# Patient Record
Sex: Male | Born: 1975 | Race: White | Hispanic: No | Marital: Married | Smoking: Former smoker
Health system: Southern US, Community
[De-identification: ages and names within clinical notes are randomized; demographics above are authoritative.]

## PROBLEM LIST (undated history)

## (undated) DIAGNOSIS — I10 Essential (primary) hypertension: Secondary | ICD-10-CM

## (undated) DIAGNOSIS — E785 Hyperlipidemia, unspecified: Secondary | ICD-10-CM

## (undated) HISTORY — DX: Essential (primary) hypertension: I10

## (undated) HISTORY — PX: HERNIA REPAIR: SHX51

## (undated) HISTORY — PX: BACK SURGERY: SHX140

## (undated) HISTORY — PX: APPENDECTOMY: SHX54

## (undated) HISTORY — PX: HAND TENDON SURGERY: SHX663

## (undated) HISTORY — DX: Hyperlipidemia, unspecified: E78.5

---

## 2013-02-02 ENCOUNTER — Ambulatory Visit: Payer: Self-pay | Admitting: Family Medicine

## 2013-09-22 ENCOUNTER — Ambulatory Visit: Payer: Self-pay | Admitting: Unknown Physician Specialty

## 2013-09-30 DIAGNOSIS — M5126 Other intervertebral disc displacement, lumbar region: Secondary | ICD-10-CM | POA: Insufficient documentation

## 2013-09-30 DIAGNOSIS — M545 Low back pain, unspecified: Secondary | ICD-10-CM | POA: Insufficient documentation

## 2014-08-06 ENCOUNTER — Ambulatory Visit (INDEPENDENT_AMBULATORY_CARE_PROVIDER_SITE_OTHER): Payer: No Typology Code available for payment source | Admitting: Family Medicine

## 2014-08-06 ENCOUNTER — Other Ambulatory Visit: Payer: Self-pay

## 2014-08-06 ENCOUNTER — Encounter: Payer: Self-pay | Admitting: Family Medicine

## 2014-08-06 VITALS — BP 136/98 | HR 70 | Ht 69.0 in | Wt 205.0 lb

## 2014-08-06 DIAGNOSIS — N63 Unspecified lump in unspecified breast: Secondary | ICD-10-CM

## 2014-08-06 DIAGNOSIS — I10 Essential (primary) hypertension: Secondary | ICD-10-CM | POA: Diagnosis not present

## 2014-08-06 DIAGNOSIS — F909 Attention-deficit hyperactivity disorder, unspecified type: Secondary | ICD-10-CM | POA: Insufficient documentation

## 2014-08-06 DIAGNOSIS — M5136 Other intervertebral disc degeneration, lumbar region: Secondary | ICD-10-CM | POA: Insufficient documentation

## 2014-08-06 DIAGNOSIS — E7849 Other hyperlipidemia: Secondary | ICD-10-CM | POA: Insufficient documentation

## 2014-08-06 MED ORDER — HYDROCHLOROTHIAZIDE 12.5 MG PO TABS: 12.5000 mg | ORAL_TABLET | Freq: Every day | ORAL | Status: DC

## 2014-08-06 NOTE — Progress Notes (Signed)
Name: Eugene Huerta   MRN: [id number]    DOB: 1975/04/09   Date:08/06/2014       Progress Note  Subjective  Chief Complaint  Chief Complaint  Patient presents with  . Breast Mass    size of a pea/ sore to touch- also feels small knot under arm    Hypertension This is a recurrent problem. The current episode started today. The problem has been gradually worsening since onset. The problem is uncontrolled. Associated symptoms include anxiety. Pertinent negatives include no chest pain, headaches, malaise/fatigue or palpitations. (Backpain) There are no associated agents to hypertension. There are no known risk factors for coronary artery disease. Past treatments include ACE inhibitors. The current treatment provides mild improvement. Compliance problems include medication side effects (hives).  There is no history of angina, kidney disease or CVA.    No problem-specific assessment & plan notes found for this encounter.   Past Medical History  Diagnosis Date  . Hyperlipidemia     Past Surgical History  Procedure Laterality Date  . Back surgery      x 2  . Hernia repair    . Appendectomy    . Hand tendon surgery      x 2    Family History  Problem Relation Age of Onset  . Hypertension Father     History   Social History  . Marital Status: Married    Spouse Name: N/A  . Number of Children: N/A  . Years of Education: N/A   Occupational History  . Not on file.   Social History Main Topics  . Smoking status: Former Games developermoker  . Smokeless tobacco: Not on file  . Alcohol Use: No  . Drug Use: No  . Sexual Activity: Not on file   Other Topics Concern  . Not on file   Social History Narrative  . No narrative on file    No Known Allergies   Review of Systems  Constitutional: Negative for fever, chills, weight loss and malaise/fatigue.  HENT: Negative for congestion.   Cardiovascular: Negative for chest pain and palpitations.  Gastrointestinal: Negative for  heartburn, nausea and vomiting.  Genitourinary: Negative for dysuria.  Musculoskeletal: Positive for back pain.  Skin: Negative for rash.  Neurological: Negative for headaches.  Endo/Heme/Allergies: Does not bruise/bleed easily.  Psychiatric/Behavioral: Negative for depression.     Objective  Filed Vitals:   08/06/14 1025  BP: 136/98  Pulse: 70  Height: 5\' 9"  (1.753 m)  Weight: 205 lb (92.987 kg)    Physical Exam  Constitutional: He is oriented to person, place, and time.  HENT:  Head: Normocephalic.  Right Ear: External ear normal.  Left Ear: External ear normal.  Nose: Nose normal.  Mouth/Throat: Oropharynx is clear and moist.  Eyes: Conjunctivae and EOM are normal. Pupils are equal, round, and reactive to light.  Neck: Normal range of motion. Neck supple. No thyromegaly present.  Cardiovascular: Normal rate, regular rhythm, normal heart sounds and intact distal pulses.   No murmur heard. Pulmonary/Chest: Effort normal and breath sounds normal. No respiratory distress. Left breast exhibits mass and tenderness. Left breast exhibits no inverted nipple and no nipple discharge.  1 cm /tender left upper quad  Abdominal: Soft. Bowel sounds are normal.  Lymphadenopathy:    He has no cervical adenopathy.  Neurological: He is alert and oriented to person, place, and time.  Skin: He is not diaphoretic. No erythema.      No results found for this or any  previous visit (from the past 2160 hour(s)).   Assessment & Plan  Problem List Items Addressed This Visit    None    Visit Diagnoses    Essential hypertension    -  Primary    Relevant Medications    hydrochlorothiazide (HYDRODIURIL) 12.5 MG tablet    Breast mass in male        Relevant Orders    MM Digital Diagnostic Unilat L         Dr. [facility]  08/06/2014

## 2014-08-06 NOTE — Addendum Note (Signed)
Addended by: Arthur HolmsLYNCH, Arne Schlender L on: 08/06/2014 02:00 PM   Modules accepted: Orders

## 2014-08-12 ENCOUNTER — Other Ambulatory Visit: Payer: Self-pay | Admitting: Family Medicine

## 2014-08-12 ENCOUNTER — Ambulatory Visit: Payer: No Typology Code available for payment source

## 2014-08-12 ENCOUNTER — Ambulatory Visit
Admission: RE | Admit: 2014-08-12 | Discharge: 2014-08-12 | Disposition: A | Discharge status: Home / Self Care | Payer: No Typology Code available for payment source | Source: Ambulatory Visit | Attending: Family Medicine | Admitting: Family Medicine

## 2014-08-12 DIAGNOSIS — N63 Unspecified lump in unspecified breast: Secondary | ICD-10-CM

## 2014-08-12 DIAGNOSIS — N62 Hypertrophy of breast: Secondary | ICD-10-CM | POA: Diagnosis not present

## 2014-09-15 ENCOUNTER — Ambulatory Visit: Payer: No Typology Code available for payment source | Admitting: Family Medicine

## 2014-12-17 ENCOUNTER — Other Ambulatory Visit: Payer: Self-pay | Admitting: Family Medicine

## 2015-03-07 ENCOUNTER — Other Ambulatory Visit: Payer: Self-pay | Admitting: Family Medicine

## 2015-09-07 ENCOUNTER — Other Ambulatory Visit: Payer: Self-pay

## 2015-09-09 ENCOUNTER — Ambulatory Visit (INDEPENDENT_AMBULATORY_CARE_PROVIDER_SITE_OTHER): Payer: Medicaid Other | Admitting: Family Medicine

## 2015-09-09 ENCOUNTER — Encounter: Payer: Self-pay | Admitting: Family Medicine

## 2015-09-09 VITALS — BP 138/98 | HR 80 | Ht 69.0 in | Wt 212.0 lb

## 2015-09-09 DIAGNOSIS — E785 Hyperlipidemia, unspecified: Secondary | ICD-10-CM | POA: Diagnosis not present

## 2015-09-09 DIAGNOSIS — I10 Essential (primary) hypertension: Secondary | ICD-10-CM

## 2015-09-09 MED ORDER — HYDROCHLOROTHIAZIDE 25 MG PO TABS: ORAL_TABLET | ORAL | Status: DC

## 2015-09-09 NOTE — Progress Notes (Signed)
Name: Eugene Huerta   MRN: [id number]    DOB: 1975-04-23   Date:09/09/2015       Progress Note  Subjective  Chief Complaint  Chief Complaint  Patient presents with  . ADHD    advised by his pain Dr. to get back on med especially when driving long distances    HPI Comments: Patient told unlikely to restart stimulant since bp elevated/ not working  Hypertension This is a recurrent problem. The current episode started more than 1 year ago. The problem has been gradually worsening since onset. The problem is uncontrolled. Pertinent negatives include no anxiety, blurred vision, chest pain, headaches, malaise/fatigue, neck pain, orthopnea, palpitations, peripheral edema, PND, shortness of breath or sweats. There are no associated agents to hypertension. There are no known risk factors for coronary artery disease. Past treatments include nothing. The current treatment provides no improvement. There are no compliance problems.  There is no history of angina, kidney disease, CAD/MI, CVA, heart failure, left ventricular hypertrophy, PVD, renovascular disease or retinopathy. There is no history of chronic renal disease or a hypertension causing med.    No problem-specific assessment & plan notes found for this encounter.   Past Medical History  Diagnosis Date  . Hyperlipidemia   . Hypertension     Past Surgical History  Procedure Laterality Date  . Back surgery      x 2  . Hernia repair    . Appendectomy    . Hand tendon surgery      x 2    Family History  Problem Relation Age of Onset  . Hypertension Father     Social History   Social History  . Marital Status: Married    Spouse Name: N/A  . Number of Children: N/A  . Years of Education: N/A   Occupational History  . Not on file.   Social History Main Topics  . Smoking status: Former Games developermoker  . Smokeless tobacco: Not on file  . Alcohol Use: No  . Drug Use: No  . Sexual Activity: Yes   Other Topics Concern  .  Not on file   Social History Narrative    No Known Allergies   Review of Systems  Constitutional: Negative for fever, chills, weight loss and malaise/fatigue.  HENT: Negative for ear discharge, ear pain and sore throat.   Eyes: Negative for blurred vision.  Respiratory: Negative for cough, sputum production, shortness of breath and wheezing.   Cardiovascular: Negative for chest pain, palpitations, orthopnea, leg swelling and PND.  Gastrointestinal: Negative for heartburn, nausea, abdominal pain, diarrhea, constipation, blood in stool and melena.  Genitourinary: Negative for dysuria, urgency, frequency and hematuria.  Musculoskeletal: Positive for back pain. Negative for myalgias, joint pain and neck pain.  Skin: Negative for rash.  Neurological: Negative for dizziness, tingling, sensory change, focal weakness and headaches.  Endo/Heme/Allergies: Negative for environmental allergies and polydipsia. Does not bruise/bleed easily.  Psychiatric/Behavioral: Negative for depression and suicidal ideas. The patient is not nervous/anxious and does not have insomnia.      Objective  Filed Vitals:   09/09/15 1055  BP: 138/98  Pulse: 80  Height: 5\' 9"  (1.753 m)  Weight: 212 lb (96.163 kg)    Physical Exam  Constitutional: He is oriented to person, place, and time and well-developed, well-nourished, and in no distress.  HENT:  Head: Normocephalic.  Right Ear: External ear normal.  Left Ear: External ear normal.  Nose: Nose normal.  Mouth/Throat: Oropharynx is clear and moist.  Eyes: Conjunctivae and EOM are normal. Pupils are equal, round, and reactive to light. Right eye exhibits no discharge. Left eye exhibits no discharge. No scleral icterus.  Neck: Normal range of motion. Neck supple. No JVD present. No tracheal deviation present. No thyromegaly present.  Cardiovascular: Normal rate, regular rhythm, normal heart sounds and intact distal pulses.  Exam reveals no gallop and no  friction rub.   No murmur heard. Pulmonary/Chest: Breath sounds normal. No respiratory distress. He has no wheezes. He has no rales.  Abdominal: Soft. Bowel sounds are normal. He exhibits no mass. There is no hepatosplenomegaly. There is no tenderness. There is no rebound, no guarding and no CVA tenderness.  Musculoskeletal: Normal range of motion. He exhibits no edema or tenderness.  Lymphadenopathy:    He has no cervical adenopathy.  Neurological: He is alert and oriented to person, place, and time. He has normal sensation, normal strength, normal reflexes and intact cranial nerves. No cranial nerve deficit.  Skin: Skin is warm. No rash noted.  Psychiatric: Mood and affect normal.  Nursing note and vitals reviewed.     Assessment & Plan  Problem List Items Addressed This Visit    None    Visit Diagnoses    Essential hypertension    -  Primary    Relevant Medications    hydrochlorothiazide (HYDRODIURIL) 25 MG tablet    Other Relevant Orders    Renal Function Panel    Hyperlipidemia        Relevant Medications    hydrochlorothiazide (HYDRODIURIL) 25 MG tablet    Other Relevant Orders    Lipid Profile         Dr. [facility] Sauereanna [facility]  09/09/2015

## 2015-09-10 LAB — RENAL FUNCTION PANEL
Albumin: 4.2 g/dL (ref 3.5–5.5)
BUN / CREAT RATIO: 14 (ref 9–20)
BUN: 15 mg/dL (ref 6–20)
CHLORIDE: 100 mmol/L (ref 96–106)
CO2: 25 mmol/L (ref 18–29)
Calcium: 9.7 mg/dL (ref 8.7–10.2)
Creatinine, Ser: 1.04 mg/dL (ref 0.76–1.27)
GFR calc non Af Amer: 91 mL/min/{1.73_m2} (ref >59)
GFR, EST AFRICAN AMERICAN: 105 mL/min/{1.73_m2} (ref >59)
Glucose: 96 mg/dL (ref 65–99)
POTASSIUM: 5.2 mmol/L (ref 3.5–5.2)
Phosphorus: 4.5 mg/dL (ref 2.5–4.5)
SODIUM: 144 mmol/L (ref 134–144)

## 2015-09-10 LAB — LIPID PANEL
Chol/HDL Ratio: 7 ratio units — ABNORMAL HIGH (ref 0.0–5.0)
Cholesterol, Total: 224 mg/dL — ABNORMAL HIGH (ref 100–199)
HDL: 32 mg/dL — ABNORMAL LOW (ref >39)
LDL Calculated: 134 mg/dL — ABNORMAL HIGH (ref 0–99)
Triglycerides: 288 mg/dL — ABNORMAL HIGH (ref 0–149)
VLDL Cholesterol Cal: 58 mg/dL — ABNORMAL HIGH (ref 5–40)

## 2015-10-07 ENCOUNTER — Ambulatory Visit: Payer: Medicaid Other | Admitting: Family Medicine

## 2015-10-11 ENCOUNTER — Ambulatory Visit: Payer: Medicaid Other | Admitting: Family Medicine

## 2015-10-13 ENCOUNTER — Ambulatory Visit (INDEPENDENT_AMBULATORY_CARE_PROVIDER_SITE_OTHER): Payer: Medicaid Other | Admitting: Family Medicine

## 2015-10-13 ENCOUNTER — Encounter: Payer: Self-pay | Admitting: Family Medicine

## 2015-10-13 VITALS — BP 130/98 | HR 64 | Ht 69.0 in | Wt 212.0 lb

## 2015-10-13 DIAGNOSIS — L918 Other hypertrophic disorders of the skin: Secondary | ICD-10-CM | POA: Diagnosis not present

## 2015-10-13 DIAGNOSIS — G4730 Sleep apnea, unspecified: Secondary | ICD-10-CM | POA: Insufficient documentation

## 2015-10-13 DIAGNOSIS — I10 Essential (primary) hypertension: Secondary | ICD-10-CM | POA: Diagnosis not present

## 2015-10-13 DIAGNOSIS — E785 Hyperlipidemia, unspecified: Secondary | ICD-10-CM

## 2015-10-13 MED ORDER — LISINOPRIL 10 MG PO TABS: 10.0000 mg | ORAL_TABLET | Freq: Every day | ORAL | 6 refills | Status: DC

## 2015-10-13 MED ORDER — SIMVASTATIN 20 MG PO TABS
20.0000 mg | ORAL_TABLET | ORAL | 6 refills | Status: DC
Start: 2015-10-13 — End: 2015-11-03

## 2015-10-13 NOTE — Progress Notes (Signed)
Name: Hallie Ishida   MRN: [id number]    DOB: [birth year]   Date:10/13/2015       Progress Note  Subjective  Chief Complaint  Chief Complaint  Patient presents with  . Hyperlipidemia    had labs but needs chol med sent in  . Sleep Apnea    has had witnessed apnea, snoring, and has woke himself up by gasping for air    Patient needs evaluation for sleep apnea/ positive snoring/ witness apneic episodes/ daytime somnolence/ somnolence while driving/ fall asleep in chair /    Hyperlipidemia  This is a chronic problem. The current episode started more than 1 year ago. The problem is controlled. Recent lipid tests were reviewed and are normal. He has no history of chronic renal disease, diabetes, hypothyroidism, liver disease, obesity or nephrotic syndrome. Factors aggravating his hyperlipidemia include thiazides. Pertinent negatives include no chest pain, focal sensory loss, focal weakness, leg pain, myalgias (hyperten) or shortness of breath. The current treatment provides significant improvement of lipids. There are no compliance problems.  Risk factors for coronary artery disease include dyslipidemia and male sex.  Hypertension  This is a chronic problem. The current episode started more than 1 year ago. The problem has been waxing and waning since onset. The problem is controlled. Associated symptoms include headaches and malaise/fatigue. Pertinent negatives include no anxiety, blurred vision, chest pain, neck pain, orthopnea, palpitations, peripheral edema, PND, shortness of breath or sweats. (Backpain) There is no history of chronic renal disease.    No problem-specific Assessment & Plan notes found for this encounter.   Past Medical History:  Diagnosis Date  . Hyperlipidemia   . Hypertension     Past Surgical History:  Procedure Laterality Date  . APPENDECTOMY    . BACK SURGERY     x 2  . HAND TENDON SURGERY     x 2  . HERNIA REPAIR      Family History  Problem  Relation Age of Onset  . Hypertension Father     Social History   Social History  . Marital status: Married    Spouse name: N/A  . Number of children: N/A  . Years of education: N/A   Occupational History  . Not on file.   Social History Main Topics  . Smoking status: Former Games developer  . Smokeless tobacco: Not on file  . Alcohol use No  . Drug use: No  . Sexual activity: Yes   Other Topics Concern  . Not on file   Social History Narrative  . No narrative on file    No Known Allergies   Review of Systems  Constitutional: Positive for malaise/fatigue. Negative for chills, fever and weight loss.  HENT: Negative for ear discharge, ear pain and sore throat.   Eyes: Negative for blurred vision.  Respiratory: Negative for cough, sputum production, shortness of breath and wheezing.   Cardiovascular: Negative for chest pain, palpitations, orthopnea, leg swelling and PND.  Gastrointestinal: Negative for abdominal pain, blood in stool, constipation, diarrhea, heartburn, melena and nausea.  Genitourinary: Negative for dysuria, frequency, hematuria and urgency.  Musculoskeletal: Positive for back pain. Negative for joint pain, myalgias (hyperten) and neck pain.  Skin: Negative for rash.  Neurological: Positive for headaches. Negative for dizziness, tingling, sensory change and focal weakness.  Endo/Heme/Allergies: Negative for environmental allergies and polydipsia. Does not bruise/bleed easily.  Psychiatric/Behavioral: Negative for depression and suicidal ideas. The patient is not nervous/anxious and does not have insomnia.  Objective  Vitals:   10/13/15 0816  BP: (!) 130/98  Pulse: 64  Weight: 212 lb (96.2 kg)  Height: 5\' 9"  (1.753 m)    Physical Exam  Constitutional: He is oriented to person, place, and time and well-developed, well-nourished, and in no distress.  HENT:  Head: Normocephalic.  Right Ear: External ear normal.  Left Ear: External ear normal.  Nose:  Nose normal.  Mouth/Throat: Oropharynx is clear and moist.  Eyes: Conjunctivae and EOM are normal. Pupils are equal, round, and reactive to light. Right eye exhibits no discharge. Left eye exhibits no discharge. No scleral icterus.  Neck: Normal range of motion. Neck supple. No JVD present. No tracheal deviation present. No thyromegaly present.  Cardiovascular: Normal rate, regular rhythm, normal heart sounds and intact distal pulses.  Exam reveals no gallop and no friction rub.   No murmur heard. Pulmonary/Chest: Breath sounds normal. No respiratory distress. He has no wheezes. He has no rales.  Abdominal: Soft. Bowel sounds are normal. He exhibits no mass. There is no hepatosplenomegaly. There is no tenderness. There is no rebound, no guarding and no CVA tenderness.  Musculoskeletal: Normal range of motion. He exhibits no edema or tenderness.  Lymphadenopathy:    He has no cervical adenopathy.  Neurological: He is alert and oriented to person, place, and time. He has normal sensation, normal strength, normal reflexes and intact cranial nerves. No cranial nerve deficit.  Skin: Skin is warm. No rash noted.  Psychiatric: Mood and affect normal.      Assessment & Plan  Problem List Items Addressed This Visit      Cardiovascular and Mediastinum   Essential hypertension   Relevant Medications   lisinopril (PRINIVIL,ZESTRIL) 10 MG tablet   simvastatin (ZOCOR) 20 MG tablet     Other   Hyperlipidemia - Primary   Relevant Medications   lisinopril (PRINIVIL,ZESTRIL) 10 MG tablet   simvastatin (ZOCOR) 20 MG tablet   Sleep apnea   Relevant Orders   Ambulatory referral to Sleep Studies    Other Visit Diagnoses    Skin tag       Relevant Orders   Ambulatory referral to Dermatology        Dr. Elizabeth Sauereanna Cagney [facility]  10/13/15

## 2015-11-03 ENCOUNTER — Ambulatory Visit (INDEPENDENT_AMBULATORY_CARE_PROVIDER_SITE_OTHER): Payer: BLUE CROSS/BLUE SHIELD | Admitting: Family Medicine

## 2015-11-03 ENCOUNTER — Encounter: Payer: Self-pay | Admitting: Family Medicine

## 2015-11-03 VITALS — BP 120/80 | HR 80 | Ht 69.0 in | Wt 219.0 lb

## 2015-11-03 DIAGNOSIS — F900 Attention-deficit hyperactivity disorder, predominantly inattentive type: Secondary | ICD-10-CM

## 2015-11-03 DIAGNOSIS — E785 Hyperlipidemia, unspecified: Secondary | ICD-10-CM

## 2015-11-03 DIAGNOSIS — I10 Essential (primary) hypertension: Secondary | ICD-10-CM | POA: Diagnosis not present

## 2015-11-03 MED ORDER — AMPHETAMINE-DEXTROAMPHET ER 20 MG PO CP24: 20.0000 mg | ORAL_CAPSULE | ORAL | 0 refills | Status: DC

## 2015-11-03 MED ORDER — SIMVASTATIN 20 MG PO TABS: 20.0000 mg | ORAL_TABLET | ORAL | 6 refills | Status: DC

## 2015-11-03 MED ORDER — LISINOPRIL 10 MG PO TABS: 10.0000 mg | ORAL_TABLET | Freq: Every day | ORAL | 6 refills | Status: AC

## 2015-11-03 MED ORDER — SIMVASTATIN 20 MG PO TABS
20.0000 mg | ORAL_TABLET | ORAL | 6 refills | Status: AC
Start: 2015-11-03 — End: ?

## 2015-11-03 MED ORDER — LISINOPRIL 10 MG PO TABS: 10.0000 mg | ORAL_TABLET | Freq: Every day | ORAL | 6 refills | Status: DC

## 2015-11-03 NOTE — Progress Notes (Signed)
Name: Eugene Huerta   MRN: [id number]    DOB: 1975/10/21   Date:11/03/2015       Progress Note  Subjective  Chief Complaint  Chief Complaint  Patient presents with  . Hypertension    Hypertension  This is a recurrent problem. The current episode started more than 1 year ago. The problem has been rapidly improving since onset. The problem is controlled. Pertinent negatives include no anxiety, blurred vision, chest pain, headaches, malaise/fatigue, neck pain, orthopnea, palpitations, peripheral edema, PND, shortness of breath or sweats. There are no associated agents to hypertension. There are no known risk factors for coronary artery disease. Past treatments include ACE inhibitors and diuretics. The current treatment provides no improvement. There are no compliance problems.  There is no history of angina, kidney disease, CAD/MI, CVA, heart failure, left ventricular hypertrophy, PVD, renovascular disease or retinopathy. There is no history of chronic renal disease or a hypertension causing med.    No problem-specific Assessment & Plan notes found for this encounter.   Past Medical History:  Diagnosis Date  . Hyperlipidemia   . Hypertension     Past Surgical History:  Procedure Laterality Date  . APPENDECTOMY    . BACK SURGERY     x 2  . HAND TENDON SURGERY     x 2  . HERNIA REPAIR      Family History  Problem Relation Age of Onset  . Hypertension Father     Social History   Social History  . Marital status: Married    Spouse name: N/A  . Number of children: N/A  . Years of education: N/A   Occupational History  . Not on file.   Social History Main Topics  . Smoking status: Former Games developer  . Smokeless tobacco: Not on file  . Alcohol use No  . Drug use: No  . Sexual activity: Yes   Other Topics Concern  . Not on file   Social History Narrative  . No narrative on file    No Known Allergies   Review of Systems  Constitutional: Negative for chills,  fever, malaise/fatigue and weight loss.  HENT: Negative for ear discharge, ear pain and sore throat.   Eyes: Negative for blurred vision.  Respiratory: Negative for cough, sputum production, shortness of breath and wheezing.   Cardiovascular: Negative for chest pain, palpitations, orthopnea, leg swelling and PND.  Gastrointestinal: Negative for abdominal pain, blood in stool, constipation, diarrhea, heartburn, melena and nausea.  Genitourinary: Negative for dysuria, frequency, hematuria and urgency.  Musculoskeletal: Negative for back pain, joint pain, myalgias and neck pain.  Skin: Negative for rash.  Neurological: Negative for dizziness, tingling, sensory change, focal weakness and headaches.  Endo/Heme/Allergies: Negative for environmental allergies and polydipsia. Does not bruise/bleed easily.  Psychiatric/Behavioral: Negative for depression and suicidal ideas. The patient is not nervous/anxious and does not have insomnia.      Objective  Vitals:   11/03/15 0914  BP: 120/80  Pulse: 80  Weight: 219 lb (99.3 kg)  Height: 5\' 9"  (1.753 m)    Physical Exam  Constitutional: He is oriented to person, place, and time and well-developed, well-nourished, and in no distress.  HENT:  Head: Normocephalic.  Right Ear: External ear normal.  Left Ear: External ear normal.  Nose: Nose normal.  Mouth/Throat: Oropharynx is clear and moist.  Eyes: Conjunctivae and EOM are normal. Pupils are equal, round, and reactive to light. Right eye exhibits no discharge. Left eye exhibits no discharge. No scleral  icterus.  Neck: Normal range of motion. Neck supple. No JVD present. No tracheal deviation present. No thyromegaly present.  Cardiovascular: Normal rate, regular rhythm, normal heart sounds and intact distal pulses.  Exam reveals no gallop and no friction rub.   No murmur heard. Pulmonary/Chest: Breath sounds normal. No respiratory distress. He has no wheezes. He has no rales.  Abdominal: Soft.  Bowel sounds are normal. He exhibits no mass. There is no hepatosplenomegaly. There is no tenderness. There is no rebound, no guarding and no CVA tenderness.  Musculoskeletal: Normal range of motion. He exhibits no edema or tenderness.  Lymphadenopathy:    He has no cervical adenopathy.  Neurological: He is alert and oriented to person, place, and time. He has normal sensation, normal strength and intact cranial nerves. No cranial nerve deficit.  Skin: Skin is warm. No rash noted.  Psychiatric: Mood and affect normal.  Nursing note and vitals reviewed.     Assessment & Plan  Problem List Items Addressed This Visit      Cardiovascular and Mediastinum   Essential hypertension - Primary   Relevant Medications   lisinopril (PRINIVIL,ZESTRIL) 10 MG tablet   simvastatin (ZOCOR) 20 MG tablet     Other   Attention deficit disorder with hyperactivity   Relevant Medications   amphetamine-dextroamphetamine (ADDERALL XR) 20 MG 24 hr capsule   Hyperlipidemia   Relevant Medications   lisinopril (PRINIVIL,ZESTRIL) 10 MG tablet   simvastatin (ZOCOR) 20 MG tablet    Other Visit Diagnoses   None.       Dr. Hayden Rasmusseneanna Kitiara Hintze [facility]  11/03/15

## 2015-11-29 ENCOUNTER — Other Ambulatory Visit: Payer: Self-pay

## 2016-01-02 ENCOUNTER — Other Ambulatory Visit: Payer: Self-pay

## 2016-01-05 ENCOUNTER — Other Ambulatory Visit: Payer: Self-pay

## 2016-02-13 ENCOUNTER — Other Ambulatory Visit: Payer: Self-pay

## 2016-03-12 ENCOUNTER — Other Ambulatory Visit: Payer: Self-pay

## 2016-04-19 ENCOUNTER — Other Ambulatory Visit: Payer: Self-pay

## 2016-04-19 ENCOUNTER — Telehealth: Payer: Self-pay

## 2016-04-20 NOTE — Telephone Encounter (Signed)
Pt called stating he had an appt with [facility]Sarah Nelson at [facility] on 06/25/2016. He needed his Adderrall refilled until his appt. Called pt to tell him it would be up front to pick up

## 2016-05-22 ENCOUNTER — Other Ambulatory Visit: Payer: Self-pay

## 2016-06-20 DIAGNOSIS — M5416 Radiculopathy, lumbar region: Secondary | ICD-10-CM | POA: Insufficient documentation

## 2016-06-29 DIAGNOSIS — G8929 Other chronic pain: Secondary | ICD-10-CM | POA: Insufficient documentation

## 2016-06-29 DIAGNOSIS — Z9689 Presence of other specified functional implants: Secondary | ICD-10-CM | POA: Insufficient documentation

## 2016-08-28 DIAGNOSIS — G4733 Obstructive sleep apnea (adult) (pediatric): Secondary | ICD-10-CM | POA: Insufficient documentation

## 2016-10-19 ENCOUNTER — Other Ambulatory Visit: Payer: Self-pay

## 2016-10-24 ENCOUNTER — Other Ambulatory Visit: Payer: Self-pay | Admitting: Family Medicine

## 2016-10-24 DIAGNOSIS — I10 Essential (primary) hypertension: Secondary | ICD-10-CM

## 2016-10-24 DIAGNOSIS — E785 Hyperlipidemia, unspecified: Secondary | ICD-10-CM

## 2017-06-06 ENCOUNTER — Other Ambulatory Visit: Payer: Self-pay

## 2017-07-03 DIAGNOSIS — G894 Chronic pain syndrome: Secondary | ICD-10-CM | POA: Insufficient documentation

## 2018-12-25 ENCOUNTER — Ambulatory Visit: Payer: Medicare Other | Admitting: Student in an Organized Health Care Education/Training Program

## 2019-02-04 ENCOUNTER — Encounter: Payer: Self-pay | Admitting: Student in an Organized Health Care Education/Training Program

## 2019-02-04 NOTE — Progress Notes (Signed)
Patient is a current patient at [facility].  He is going there for back pain in the mid and lower back that radiates into right buttocks and down the right leg. States this started approx 5 years ago and just woke up one morning and could not moves his legs.  He has had several surgeries that did help with weakness in the legs but is still having pain. He is unhappy with his care at [facility] and states it is too far to drive.  He has a SCS in place by [facility] but is uneffective.

## 2019-02-05 ENCOUNTER — Ambulatory Visit
Payer: Medicare Other | Attending: Student in an Organized Health Care Education/Training Program | Admitting: Student in an Organized Health Care Education/Training Program

## 2019-02-05 ENCOUNTER — Encounter: Payer: Self-pay | Admitting: Student in an Organized Health Care Education/Training Program

## 2019-02-05 ENCOUNTER — Other Ambulatory Visit: Payer: Self-pay

## 2019-02-05 VITALS — Ht 70.0 in | Wt 200.0 lb

## 2019-02-05 DIAGNOSIS — M5416 Radiculopathy, lumbar region: Secondary | ICD-10-CM | POA: Diagnosis not present

## 2019-02-05 DIAGNOSIS — M961 Postlaminectomy syndrome, not elsewhere classified: Secondary | ICD-10-CM

## 2019-02-05 DIAGNOSIS — M5441 Lumbago with sciatica, right side: Secondary | ICD-10-CM

## 2019-02-05 DIAGNOSIS — G8929 Other chronic pain: Secondary | ICD-10-CM

## 2019-02-05 DIAGNOSIS — F1290 Cannabis use, unspecified, uncomplicated: Secondary | ICD-10-CM | POA: Insufficient documentation

## 2019-02-05 DIAGNOSIS — M5136 Other intervertebral disc degeneration, lumbar region: Secondary | ICD-10-CM

## 2019-02-05 DIAGNOSIS — Z9689 Presence of other specified functional implants: Secondary | ICD-10-CM

## 2019-02-05 DIAGNOSIS — M5126 Other intervertebral disc displacement, lumbar region: Secondary | ICD-10-CM

## 2019-02-05 DIAGNOSIS — G894 Chronic pain syndrome: Secondary | ICD-10-CM | POA: Diagnosis not present

## 2019-02-05 MED ORDER — TIZANIDINE HCL 4 MG PO TABS: 4.0000 mg | ORAL_TABLET | Freq: Two times a day (BID) | ORAL | 2 refills | Status: AC | PRN

## 2019-02-05 MED ORDER — GABAPENTIN 600 MG PO TABS: 600.0000 mg | ORAL_TABLET | Freq: Every day | ORAL | 2 refills | Status: DC

## 2019-02-05 NOTE — Progress Notes (Signed)
Patient's Name: Eugene Huerta  MRN: [id number]  Referring Provider: Nelwyn Salisbury, Hershal Coria  DOB: [birth year]  PCP: Juline Patch, MD  DOS: 02/05/2019  Note by: Gillis Santa, MD  Service setting: Ambulatory outpatient  Specialty: Interventional Pain Management  Location: [facility] Virtual Visit  Visit type: Initial Patient Evaluation  Patient type: New Patient   Pain Management Virtual Encounter Note - Virtual Visit via [place] (real-time audio visits between healthcare provider and patient).   Patient's Phone No.:  [phone] (home); There is no such number on file (mobile).; (Preferred) [phone] [email or web address]  [place], [place] [address] Phone: [phone] Fax: [place], [place] [address] [place] [address] Phone: [phone] Fax: [place], [place] [address] Phone: [phone] Fax: [phone]    Pre-screening note:  Our staff contacted Mr. Shelnutt and offered him an "in person", "face-to-face" appointment versus a telephone encounter. He indicated preferring the telephone encounter, at this time.  Primary Reason(s) for Visit: Tele-Encounter for initial evaluation of one or more chronic problems (new to examiner) potentially causing chronic pain, and posing a threat to normal musculoskeletal function. (Level of risk: High) CC: Back Pain (mid and lower )  I contacted [name] on 02/05/2019 via video conference.      I clearly identified myself as Gillis Santa, MD. I verified that I was speaking with the correct person using two identifiers (Name: Eugene Huerta, and date of birth: August 30, 1975).  Advanced Informed Consent I sought verbal advanced consent from  [name] for virtual visit interactions. I informed Mr. Drone of possible security and privacy concerns, risks, and limitations associated with providing "not-in-person" medical evaluation and management services. I also informed Mr. Skillern of the availability of "in-person" appointments. Finally, I informed him that there would be a charge for the virtual visit and that he could be  personally, fully or partially, financially responsible for it. Mr. Nesheiwat expressed understanding and agreed to proceed.   HPI  Eugene Huerta is a 43 y.o. year old, male patient, contacted today for an initial evaluation of his chronic pain. He has Familial multiple lipoprotein-type hyperlipidemia; Attention deficit disorder with hyperactivity; Benign hypertension; DDD (degenerative disc disease), lumbar; Hyperlipidemia; Essential hypertension; Sleep apnea; Chronic pain syndrome; Lumbar radiculopathy, chronic; Spinal cord stimulator status; Chronic midline low back pain with right-sided sciatica; Lower back pain; Lumbar herniated disc; Obstructive sleep apnea (adult) (pediatric); Failed back surgical syndrome; Lumbar post-laminectomy syndrome; and Marijuana use on their problem list.  Pain Assessment: Location: Mid, Lower Back Radiating: pain radiates into right hip/buttocks and down the right leg Onset: More than a month ago Duration: Chronic pain Quality: Aching, Burning, Constant, Discomfort, Dull, Sharp, Shooting, Nagging Severity: 6 /10 (subjective, self-reported pain score)  Effect on ADL: when having pain is difficult to concentrate and get anything done. Timing: Constant Modifying factors: medications, rest, squatting  Onset and Duration: Sudden 2015 due to herniated disc:10/15/2013 Left L4/5 Microdiskectomy ([place]) 12/07/2013 Right L4/5 Microdiskectomy ([place])  Cause of pain: Unknown however worsened after surgery Severity: Getting worse Timing: Evening Aggravating Factors: Kneeling,  Lifiting, Motion, Prolonged standing, Twisting, Walking, Walking uphill and Walking downhill Alleviating Factors: Stretching, Hot packs, Lying down, Resting, Sleeping, Standing, TENS,  Relaxation therapy and Warm showers or baths, SCS Associated Problems: Numbness and Pain that does not allow patient to sleep Quality of Pain: Burning, Intermittent, Deep, Exhausting, Getting longer, Hot, Nagging, Pulsating, Punishing and Sharp Previous Examinations or Tests: CT scan, Epidurogram, MRI scan, Nerve block, X-rays, Nerve conduction test, Neurological evaluation, Neurosurgical evaluation, Orthopedic evaluation, Chiropractic evaluation and Psychiatric evaluation Previous Treatments: Epidural steroid injections, Facet blocks, Narcotic medications, Physical Therapy, Pool exercises, Radiofrequency, Relaxation therapy, Spinal cord stimulator, Steroid treatments by mouth, Strengthening exercises, Stretching exercises, TENS and Trigger point injections  Historic Controlled Substance Pharmacotherapy Review   12/29/2018  1   12/29/2018  Buprenorphine 10 Mcg/Hr Patch  4.00  28 Al Fer   [id number]   [place] (5798)   0  0.24 mg  Comm Ins   [place]     Medications: Bottles not available for inspection. Pharmacodynamics: Desired effects: Analgesia: The patient reports <50% benefit. Reported improvement in function: The patient reports medications have not provided significant benefit Clinically meaningful improvement in function (CMIF): Medication does not meet basic CMIF Perceived effectiveness: None Undesirable effects: Side-effects or Adverse reactions: None reported Historical Monitoring: The patient  reports no history of drug use. List of all UDS Test(s): No results found for: MDMA, COCAINSCRNUR, [place], [place], CANNABQUANT, THCU, [place] List of other Serum/Urine Drug Screening Test(s):  No results found for: AMPHSCRSER, BARBSCRSER, BENZOSCRSER, COCAINSCRSER, COCAINSCRNUR, PCPSCRSER, PCPQUANT, THCSCRSER, THCU,  CANNABQUANT, OPIATESCRSER, OXYSCRSER, PROPOXSCRSER, ETH Historical Background Evaluation: [place] PMP: PDMP reviewed during this encounter. Six (6) year initial data search conducted.              Department of public safety, offender search: Editor, commissioning Information) Non-contributory Risk Assessment Profile: Aberrant behavior: None observed or detected today Risk factors for fatal opioid overdose: age 21-15 years old and sleep apnea Fatal overdose hazard ratio (HR): Calculation deferred Non-fatal overdose hazard ratio (HR): Calculation deferred Risk of opioid abuse or dependence: 0.7-3.0% with doses ? 36 MME/day and 6.1-26% with doses ? 120 MME/day. Substance use disorder (SUD) risk level: High Personal History of Substance Abuse (SUD-Substance use disorder):  Alcohol: Negative  Illegal Drugs: Negative  Rx Drugs: Negative  ORT Risk Level calculation: Moderate Risk Opioid Risk Tool - 02/04/19 1452      Family History of Substance Abuse   Alcohol  Positive Male    Illegal Drugs  Negative    Rx Drugs  Negative      Personal History of Substance Abuse   Alcohol  Negative    Illegal Drugs  Negative    Rx Drugs  Negative      Age   Age between 71-45 years   Yes      Psychological Disease   Psychological Disease  Negative    Depression  Negative      Total Score   Opioid Risk Tool Scoring  4    Opioid Risk Interpretation  Moderate Risk      ORT Scoring interpretation table:  Score <3 = Low Risk for SUD  Score between 4-7 = Moderate Risk for SUD  Score >8 = High Risk for Opioid Abuse   Pharmacologic Plan: Mr. Molyneux indicated having a preference to stay away from opioid analgesics.            Initial impression: Mr. Heggie indicated having no interest in opioid therapy, at this point.  Meds   Current Outpatient Medications:  .  amphetamine-dextroamphetamine (ADDERALL XR) 20 MG 24 hr capsule, Take 1 capsule (20 mg total) by mouth 1  day or 1 dose. Reported on 09/09/2015, Disp: 30  capsule, Rfl: 0 .  DULoxetine (CYMBALTA) 60 MG capsule, Take 60 mg by mouth daily. Dr Marland Mcalpine- pain clinic, Disp: , Rfl:  .  lisinopril (PRINIVIL,ZESTRIL) 10 MG tablet, Take 1 tablet (10 mg total) by mouth daily., Disp: 30 tablet, Rfl: 6 .  simvastatin (ZOCOR) 20 MG tablet, Take 1 tablet (20 mg total) by mouth 1 day or 1 dose. Reported on 09/09/2015, Disp: 30 tablet, Rfl: 6 .  gabapentin (NEURONTIN) 600 MG tablet, Take 1 tablet (600 mg total) by mouth at bedtime., Disp: 30 tablet, Rfl: 2 .  tiZANidine (ZANAFLEX) 4 MG tablet, Take 1 tablet (4 mg total) by mouth 2 (two) times daily as needed for muscle spasms., Disp: 60 tablet, Rfl: 2  ROS  Cardiovascular: No reported cardiovascular signs or symptoms such as High blood pressure, coronary artery disease, abnormal heart rate or rhythm, heart attack, blood thinner therapy or heart weakness and/or failure Pulmonary or Respiratory: No reported pulmonary signs or symptoms such as wheezing and difficulty taking a deep full breath (Asthma), difficulty blowing air out (Emphysema), coughing up mucus (Bronchitis), persistent dry cough, or temporary stoppage of breathing during sleep Neurological: No reported neurological signs or symptoms such as seizures, abnormal skin sensations, urinary and/or fecal incontinence, being born with an abnormal open spine and/or a tethered spinal cord Review of Past Neurological Studies: No results found for this or any previous visit. Psychological-Psychiatric: Difficulty sleeping and or falling asleep Gastrointestinal: No reported gastrointestinal signs or symptoms such as vomiting or evacuating blood, reflux, heartburn, alternating episodes of diarrhea and constipation, inflamed or scarred liver, or pancreas or irrregular and/or infrequent bowel movements Genitourinary: No reported renal or genitourinary signs or symptoms such as difficulty voiding or producing urine, peeing blood, non-functioning kidney, kidney stones, difficulty  emptying the bladder, difficulty controlling the flow of urine, or chronic kidney disease Hematological: No reported hematological signs or symptoms such as prolonged bleeding, low or poor functioning platelets, bruising or bleeding easily, hereditary bleeding problems, low energy levels due to low hemoglobin or being anemic Endocrine: No reported endocrine signs or symptoms such as high or low blood sugar, rapid heart rate due to high thyroid levels, obesity or weight gain due to slow thyroid or thyroid disease Rheumatologic: No reported rheumatological signs and symptoms such as fatigue, joint pain, tenderness, swelling, redness, heat, stiffness, decreased range of motion, with or without associated rash Musculoskeletal: Negative for myasthenia gravis, muscular dystrophy, multiple sclerosis or malignant hyperthermia   Allergies  Mr. Celli has No Known Allergies.  Laboratory Chemistry Profile   Screening No results found for: SARSCOV2NAA, COVIDSOURCE, STAPHAUREUS, MRSAPCR, HCVAB, HIV, PREGTESTUR  Inflammation (CRP: Acute Phase) (ESR: Chronic Phase) No results found for: CRP, ESRSEDRATE, LATICACIDVEN                       Rheumatology No results found for: RF, ANA, LABURIC, URICUR, LYMEIGGIGMAB, LYMEABIGMQN, HLAB27                      Renal Lab Results  Component Value Date   BUN 15 09/09/2015   CREATININE 1.04 09/09/2015   BCR 14 09/09/2015   GFRAA 105 09/09/2015   GFRNONAA 91 09/09/2015                             Hepatic Lab Results  Component Value Date   ALBUMIN 4.2 09/09/2015  Electrolytes Lab Results  Component Value Date   NA 144 09/09/2015   K 5.2 09/09/2015   CL 100 09/09/2015   CALCIUM 9.7 09/09/2015   PHOS 4.5 09/09/2015                         Note: Lab results reviewed.  Imaging Review  Thoracic MRI  1. Normal spinal alignment. No fractures or bone lesions. 2. Multilevel discogenic degenerative changes with small  broad-based posterior disc bulges at T5-6, T6-7, T7-8, and T8-9. Small midline disc protrusion is present at T8-9 causing mass effect on the ventral cord surface. 3. Cystic mass in the dorsal epidural fat centered at T5-6. 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 No communication with the thecal sac, posterior elements or any other structure is identified. No other masses or abnormal enhancement.  EXAM: Thoracolumbar spine  INDICATION: 43 years old Male with spinal cord stimulator.  COMPARISON: 06/27/2016 lumbar spine x-ray   TECHNIQUE: AP and lateral views of the lower thoracic and upper lumbar spine were performed.  FINDINGS/IMPRESSION:  Lumbar spine spinal stimulator device with leads projecting over the posterior spinal canal terminating at the levels of T8 and T9.  No evidence of compression fracture involving the visualized lower thoracic and lumbar spine. Normal alignment. Normal bone density. Mild degenerative facet hypertrophy in the lower lumbar spine. No significant disc space narrowing.  Suture material present in the right lower abdomen. Complexity Note: Imaging results reviewed. Results shared with Mr. Sofia, using Layman's terms.                         [place]  Drug: Mr. Jenny  reports no history of drug use. Alcohol:  reports no history of alcohol use. Tobacco:  reports that he has quit smoking. He has never used smokeless tobacco. Medical:  has a past medical history of Hyperlipidemia and Hypertension. Family: family history includes Hypertension in his father.  Past Surgical History:  Procedure Laterality Date  . APPENDECTOMY    . BACK SURGERY     x 2  . HAND TENDON SURGERY     x 2  . HERNIA REPAIR     Active Ambulatory Problems    Diagnosis Date Noted  . Familial multiple lipoprotein-type hyperlipidemia 08/06/2014  . Attention deficit disorder with  hyperactivity 08/06/2014  . Benign hypertension 08/06/2014  . DDD (degenerative disc disease), lumbar 08/06/2014  . Hyperlipidemia 10/13/2015  . Essential hypertension 10/13/2015  . Sleep apnea 10/13/2015  . Chronic pain syndrome 07/03/2017  . Lumbar radiculopathy, chronic 06/20/2016  . Spinal cord stimulator status 06/29/2016  . Chronic midline low back pain with right-sided sciatica 06/29/2016  . Lower back pain 09/30/2013  . Lumbar herniated disc 09/30/2013  . Obstructive sleep apnea (adult) (pediatric) 08/28/2016  . Failed back surgical syndrome 02/05/2019  . Lumbar post-laminectomy syndrome 02/05/2019  . Marijuana use 02/05/2019   Resolved Ambulatory Problems    Diagnosis Date Noted  . No Resolved Ambulatory Problems   Past Medical History:  Diagnosis Date  . Hypertension    Assessment  Primary Diagnosis & Pertinent Problem List: The primary encounter diagnosis was Chronic pain syndrome. Diagnoses of Spinal cord stimulator status, Lumbar radiculopathy, chronic, Chronic midline low back pain with right-sided sciatica, DDD (degenerative disc disease), lumbar, Lumbar herniated disc, Failed back surgical syndrome, Lumbar post-laminectomy syndrome, and Marijuana use were  also pertinent to this visit.  Visit Diagnosis (New problems to examiner): 1. Chronic pain syndrome   2. Spinal cord stimulator status   3. Lumbar radiculopathy, chronic   4. Chronic midline low back pain with right-sided sciatica   5. DDD (degenerative disc disease), lumbar   6. Lumbar herniated disc   7. Failed back surgical syndrome   8. Lumbar post-laminectomy syndrome   9. Marijuana use    Plan of Care (Initial workup plan)  I had extensive discussion with the patient about the goals of pain management.  We discussed nonpharmacological approaches to pain management that include physical therapy, dieting, sleep hygiene, psychotherapy, interventional therapy.  We discussed the importance of understanding  the type of pain including neuropathic, nociceptive, centralized.  I also stressed the importance of multimodal analgesia with an emphasis on nondrug modalities including self management, behavioral health support and physical therapy.  We discussed the importance of physical therapy and how a individualized physical therapy and occupational therapy program tailored to patient limitations can be helpful at improving physical function. We also discussed the importance of insomnia and disrupted sleep and how improved sleep hygiene and cognitive therapy could be helpful.  Psychotherapy including CBT, mind-body therapies, pain coping strategies can be helpful for patients whose pain impacts mood, sleep, quality of life, relationships with others.  We discussed avoiding benzodiazepines.  I also had an extensive discussion with the patient about interventional therapies which is my expertise and how these could be incorporated into an effective multimodal pain management plan.  Patient is a pleasant 43 year old male who presents with a chief complaint of low back and buttock pain that is worse at night.  This makes it very difficult for him to sleep.  Patient has a history of lumbar disc herniation resulting in L4-L5 microdiscectomy on left and right (2 separate surgeries).  Patient continued to have persistent pain after his surgeries and had many interventional procedures performed at [facility] with Dr. Randolm Idol including lumbar epidural steroid injections, facet blocks and even spinal cord stimulator.  He currently has a nephro spinal cord stimulator which he states is helpful but not to the same extent as he was hoping for.  Patient was previously managed on buprenorphine at 10 mcg.  He has been off of this medication for a number of days.  He states that he would like to focus on nonopioid-based therapies which I agree with.  He states that he does have difficulty sleeping at night.  He is on Cymbalta 60 mg daily which I  instructed him to continue.  We discussed the addition of gabapentin as below and tizanidine as needed.  Risks and benefits of these medications were reviewed.  Future considerations would include Lyrica.  Pharmacotherapy (current): Medications ordered:  Meds ordered this encounter  Medications  . gabapentin (NEURONTIN) 600 MG tablet    Sig: Take 1 tablet (600 mg total) by mouth at bedtime.    Dispense:  30 tablet    Refill:  2  . tiZANidine (ZANAFLEX) 4 MG tablet    Sig: Take 1 tablet (4 mg total) by mouth 2 (two) times daily as needed for muscle spasms.    Dispense:  60 tablet    Refill:  2    Do not place this medication, or any other prescription from our practice, on "Automatic Refill". Patient may have prescription filled one day early if pharmacy is closed on scheduled refill date.   Medications administered during this visit: Ellwood Sayers had  no medications administered during this visit.   Pharmacological management options:  Opioid Analgesics: N/A  Membrane stabilizer: On Cymbalta, add Gabapentin, Consider Lyrica in future  Muscle relaxant: Tizanidine  NSAID: To be determined at a later time  Other analgesic(s): To be determined at a later time    Provider-requested follow-up: Return in about 3 months (around 05/06/2019) for Medication Management.  No future appointments.  Total duration of non-face-to-face encounter: 45 minutes.  Primary Care Physician: Juline Patch, MD Location: [facility] Outpatient Pain Management Facility Note by: Gillis Santa, MD Date: 02/05/2019; Time: 4:03 PM  Note: This dictation was prepared with Dragon dictation. Any transcriptional errors that may result from this process are unintentional.

## 2019-03-10 ENCOUNTER — Other Ambulatory Visit: Payer: Self-pay | Admitting: Neurosurgery

## 2019-03-10 DIAGNOSIS — M5416 Radiculopathy, lumbar region: Secondary | ICD-10-CM

## 2019-03-11 ENCOUNTER — Other Ambulatory Visit (HOSPITAL_COMMUNITY): Payer: Self-pay | Admitting: Neurosurgery

## 2019-03-11 DIAGNOSIS — M5416 Radiculopathy, lumbar region: Secondary | ICD-10-CM

## 2019-03-19 ENCOUNTER — Ambulatory Visit (HOSPITAL_COMMUNITY)
Admission: RE | Admit: 2019-03-19 | Discharge: 2019-03-19 | Disposition: A | Discharge status: Home / Self Care | Payer: Medicare Other | Source: Ambulatory Visit | Attending: Neurosurgery | Admitting: Neurosurgery

## 2019-03-19 ENCOUNTER — Other Ambulatory Visit: Payer: Self-pay

## 2019-03-19 DIAGNOSIS — M5416 Radiculopathy, lumbar region: Secondary | ICD-10-CM | POA: Diagnosis present

## 2019-03-19 LAB — CREATININE, SERUM
Creatinine, Ser: 0.89 mg/dL (ref 0.61–1.24)
GFR calc Af Amer: >60 mL/min (ref >60)
GFR calc non Af Amer: >60 mL/min (ref >60)

## 2019-03-19 MED ORDER — GADOBUTROL 1 MMOL/ML IV SOLN
10.0000 mL | Freq: Once | INTRAVENOUS | Status: AC | PRN
  Administered 2019-03-19: 10 mL via INTRAVENOUS

## 2019-04-10 ENCOUNTER — Telehealth: Payer: Self-pay | Admitting: *Deleted

## 2019-04-10 NOTE — Telephone Encounter (Signed)
Attempted to call for pre appointment review of allergies/meds. Mailbox is full, unable to leave a message.

## 2019-04-13 ENCOUNTER — Ambulatory Visit
Payer: Medicare Other | Attending: Student in an Organized Health Care Education/Training Program | Admitting: Student in an Organized Health Care Education/Training Program

## 2019-04-13 ENCOUNTER — Other Ambulatory Visit: Payer: Self-pay

## 2019-04-13 DIAGNOSIS — G894 Chronic pain syndrome: Secondary | ICD-10-CM

## 2019-04-13 NOTE — Progress Notes (Signed)
I attempted to call the patient however no response. VM unavailable -Dr Cherylann Ratel

## 2019-04-30 ENCOUNTER — Other Ambulatory Visit: Payer: Self-pay

## 2019-04-30 ENCOUNTER — Ambulatory Visit
Payer: Medicare Other | Attending: Student in an Organized Health Care Education/Training Program | Admitting: Student in an Organized Health Care Education/Training Program

## 2019-04-30 ENCOUNTER — Encounter: Payer: Self-pay | Admitting: Student in an Organized Health Care Education/Training Program

## 2019-04-30 DIAGNOSIS — M5416 Radiculopathy, lumbar region: Secondary | ICD-10-CM | POA: Diagnosis not present

## 2019-04-30 DIAGNOSIS — G894 Chronic pain syndrome: Secondary | ICD-10-CM

## 2019-04-30 MED ORDER — GABAPENTIN 600 MG PO TABS: 1200.0000 mg | ORAL_TABLET | Freq: Every day | ORAL | 2 refills | Status: DC

## 2019-04-30 NOTE — Progress Notes (Signed)
Patient: Eugene Huerta  Service Category: E/M  Provider: Gillis Santa, MD  DOB: January 20, 1976  DOS: 04/30/2019  Location: Office  MRN: [id number]  Setting: Ambulatory outpatient  Referring Provider: Juline Patch, MD  Type: Established Patient  Specialty: Interventional Pain Management  PCP: Minna Antis  Location: Home  Delivery: TeleHealth     Virtual Encounter - Pain Management PROVIDER NOTE: Information contained herein reflects review and annotations entered in association with encounter. Interpretation of such information and data should be left to medically-trained personnel. Information provided to patient can be located elsewhere in the medical record under "Patient Instructions". Document created using STT-dictation technology, any transcriptional errors that may result from process are unintentional.    Contact & Pharmacy Preferred: [phone] Home: [phone] (home) Mobile: There is no such number on file (mobile). E-mail: [email or web address]  [place], [place] [address] Phone: [phone] Fax: [facility] Ar[place], [place] [address] [place] [address] Phone: [phone] Fax: Ali Molina [place], [place] [address] Phone: [phone] Fax: [phone]   Pre-screening  Mr. Eugene Huerta offered "in-person" vs "virtual" encounter. He indicated preferring virtual for this encounter.   Reason COVID-19*  Social distancing based on CDC and AMA recommendations.   I contacted Eugene Huerta on 04/30/2019 via telephone.      I clearly identified myself as Gillis Santa, MD. I verified that I was speaking with the correct person using two identifiers (Name: Eugene Huerta, and date of birth: 01-Dec-1975).  This visit was  completed via telephone due to the restrictions of the COVID-19 pandemic. All issues as above were discussed and addressed but no physical exam was performed. If it was felt that the patient should be evaluated in the office, they were directed there. The patient verbally consented to this visit. Patient was unable to complete an audio/visual visit due to Technical difficulties and/or Lack of internet. Due to the catastrophic nature of the COVID-19 pandemic, this visit was done through audio contact only.  Location of the patient: home address (see Epic for details)  Location of the provider: office  Consent I sought verbal advanced consent from Eugene Huerta for virtual visit interactions. I informed Eugene Huerta of possible security and privacy concerns, risks, and limitations associated with providing "not-in-person" medical evaluation and management services. I also informed Eugene Huerta of the availability of "in-person" appointments. Finally, I informed him that there would be a charge for the virtual visit and that he could be  personally, fully or partially, financially responsible for it. Eugene Huerta expressed understanding and agreed to proceed.   Historic Elements   Eugene Huerta is a 44 y.o. year old, male patient evaluated today after his last contact with our practice on 04/13/2019. Eugene Huerta  has a past medical history of Hyperlipidemia and Hypertension. He also  has a past surgical history that includes Back surgery; Hernia repair; Appendectomy; and Hand tendon surgery. Eugene Huerta has a current medication list which includes the following prescription(s): amphetamine-dextroamphetamine, duloxetine, gabapentin, lisinopril, simvastatin, buprenorphine, and tizanidine. He  reports that he has quit smoking. He has never used smokeless tobacco. He reports that he does not drink alcohol or use drugs. Eugene Huerta has No Known Allergies.  HPI  Today, he is being contacted for  medication management.   Increased low back pain after our initial visit on 02/05/2019, had difficulty walking on right leg. Pain radiating down right leg. Also having referred knee pain. [facility]- had MRI performed with contrast. We don't have results yet. Surgeon per patient informed him that he was not a surgical candidate. Is utilizing his Nevro SCS which he states started help. Has modified diet as well.   Is utilizing Gabapentin 600 mg qhs along with Ibuprofen which is helpful.  Laboratory Chemistry Profile   Renal Lab Results  Component Value Date   BUN 15 09/09/2015   CREATININE 0.89 03/19/2019   BCR 14 09/09/2015   GFRAA >60 03/19/2019   GFRNONAA >60 03/19/2019    Hepatic Lab Results  Component Value Date   ALBUMIN 4.2 09/09/2015    Electrolytes Lab Results  Component Value Date   NA 144 09/09/2015   K 5.2 09/09/2015   CL 100 09/09/2015   CALCIUM 9.7 09/09/2015   PHOS 4.5 09/09/2015    Bone No results found for: VD25OH, VD125OH2TOT, KT6256LS9, HT3428JG8, 25OHVITD1, 25OHVITD2, 25OHVITD3, TESTOFREE, TESTOSTERONE  Inflammation (CRP: Acute Phase) (ESR: Chronic Phase) No results found for: CRP, ESRSEDRATE, LATICACIDVEN    Note: Above Lab results reviewed.  Imaging  MR LUMBAR SPINE W WO CONTRAST CLINICAL DATA:  Low back pain and bilateral leg pain, right greater than left.  EXAM: MRI LUMBAR SPINE WITHOUT AND WITH CONTRAST  TECHNIQUE: Multiplanar and multiecho pulse sequences of the lumbar spine were obtained without and with intravenous contrast.  CONTRAST:  28m GADAVIST GADOBUTROL 1 MMOL/ML IV SOLN  COMPARISON:  MRI dated 09/22/2013  FINDINGS: Segmentation:  Standard.  Alignment:  Physiologic.  Vertebrae:  No fracture, evidence of discitis, or bone lesion.  Conus medullaris and cauda equina: Conus extends to the L1-2 level. Thoracic spinal cord stimulator wires adjacent to the thecal sac at that level. Conus and cauda equina  appear normal.  Paraspinal and other soft tissues: Negative.  Disc levels:  L1-2 and L2-3: Normal.  L3-4: Normal.  L4-5: Interval removal of the prominent central disc protrusion seen on the prior study. Residual scar tissue anterior to the thecal sac at that level. No focal neural impingement. The thecal sac is slightly compressed by the scar tissue anteriorly. No focal neural impingement.  L5-S1: Normal.  CLINICAL DATA:  Low back pain and bilateral leg pain, right greater than left.  EXAM: MRI LUMBAR SPINE WITHOUT AND WITH CONTRAST  TECHNIQUE: Multiplanar and multiecho pulse sequences of the lumbar spine were obtained without and with intravenous contrast.  CONTRAST:  148mGADAVIST GADOBUTROL 1 MMOL/ML IV SOLN  COMPARISON:  MRI dated 09/22/2013  FINDINGS: Segmentation:  Standard.  Alignment:  Physiologic.  Vertebrae:  No fracture, evidence of discitis, or bone lesion.  Conus medullaris and cauda equina: Conus extends to the L1-2 level. Thoracic spinal cord stimulator wires adjacent to the thecal sac at that level. Conus and cauda equina appear normal.  Paraspinal and other soft tissues: Negative.  Disc levels:  L1-2 and L2-3: Normal.  L3-4: Normal.  L4-5: Interval removal of the prominent central disc protrusion seen on the prior study. Residual scar tissue anterior to the thecal sac at that level. No focal neural impingement. The thecal sac is slightly compressed by the scar tissue anteriorly. No focal neural impingement.  L5-S1: Normal.  IMPRESSION: 1. Interval removal of the prominent central disc protrusion at L4-5 with residual scar tissue anterior to  the thecal sac at that level is slight compression of the sac but without focal neural impingement. 2. Otherwise, normal exam.  Electronically Signed   By: Lorriane Shire M.D.   On: 03/19/2019 14:53  Assessment  The primary encounter diagnosis was Chronic pain syndrome. A diagnosis of Lumbar  radiculopathy, chronic was also pertinent to this visit.  Plan of Care   Eugene Huerta has a current medication list which includes the following long-term medication(s): amphetamine-dextroamphetamine, duloxetine, gabapentin, lisinopril, and simvastatin.  1.  Increase gabapentin to 1200 mg nightly.  If any side effects at 1200 mg, patient instructed to reduce to 900 mg. 2.  As needed lumbar epidural steroid injection at L5-S1; history of L4-L5 laminectomy.  Nevro spinal cord stimulator in place.  Encouraged continued use.  Pharmacotherapy (Medications Ordered): Meds ordered this encounter  Medications  . gabapentin (NEURONTIN) 600 MG tablet    Sig: Take 2 tablets (1,200 mg total) by mouth at bedtime.    Dispense:  60 tablet    Refill:  2   Orders:  Orders Placed This Encounter  Procedures  . Lumbar Epidural Injection    Standing Status:   Standing    Number of Occurrences:   9    Standing Expiration Date:   10/27/2020    Scheduling Instructions:     Purpose: Palliative l5/s1     Indication: Lower extremity pain/Sciatica unspecified side (M54.30).     Side: Midline     Level: TBD     Sedation: Patient's choice.     TIMEFRAME: PRN procedure. (Eugene Huerta will call when needed.)    Order Specific Question:   Where will this procedure be performed?    Answer:   [facility]   Follow-up plan:   Return in about 3 months (around 07/28/2019) for Medication Management, in person.    Recent Visits Date Type Provider Dept  02/05/19 Office Visit Gillis Santa, MD [facility]  Showing recent visits within past 90 days and meeting all other requirements   Today's Visits Date Type Provider Dept  04/30/19 Office Visit Gillis Santa, MD [facility]  Showing today's visits and meeting all other requirements   Future Appointments No visits were found meeting these conditions.  Showing future appointments within next 90 days and meeting all other  requirements   I discussed the assessment and treatment plan with the patient. The patient was provided an opportunity to ask questions and all were answered. The patient agreed with the plan and demonstrated an understanding of the instructions.  Patient advised to call back or seek an in-person evaluation if the symptoms or condition worsens.  Duration of encounter: 25 minutes.  Note by: Gillis Santa, MD Date: 04/30/2019; Time: 11:09 AM

## 2019-05-04 ENCOUNTER — Other Ambulatory Visit: Payer: Self-pay | Admitting: Student in an Organized Health Care Education/Training Program

## 2019-05-04 DIAGNOSIS — G894 Chronic pain syndrome: Secondary | ICD-10-CM

## 2019-05-04 DIAGNOSIS — M5416 Radiculopathy, lumbar region: Secondary | ICD-10-CM

## 2019-07-28 ENCOUNTER — Other Ambulatory Visit: Payer: Self-pay

## 2019-07-28 ENCOUNTER — Ambulatory Visit
Payer: Medicare Other | Attending: Student in an Organized Health Care Education/Training Program | Admitting: Student in an Organized Health Care Education/Training Program

## 2019-07-28 DIAGNOSIS — G894 Chronic pain syndrome: Secondary | ICD-10-CM

## 2019-07-28 NOTE — Progress Notes (Signed)
I attempted to call the patient however no response. No VM option -Dr Joyia Riehle  

## 2019-08-10 ENCOUNTER — Other Ambulatory Visit: Payer: Self-pay | Admitting: Student in an Organized Health Care Education/Training Program

## 2019-08-10 DIAGNOSIS — M5416 Radiculopathy, lumbar region: Secondary | ICD-10-CM

## 2019-08-10 DIAGNOSIS — G894 Chronic pain syndrome: Secondary | ICD-10-CM

## 2020-09-08 IMAGING — MR MR LUMBAR SPINE WO/W CM
4 of 8 series · 25 of 48 positions shown · IV contrast (gadavist)
Comparison: MRI dated 09/22/2013
COMPARISON: MRI dated 09/22/2013

CLINICAL DATA: Low back pain and bilateral leg pain, right greater
than left.

EXAM:
MRI LUMBAR SPINE WITHOUT AND WITH CONTRAST
TECHNIQUE: Multiplanar and multiecho pulse sequences of the lumbar spine were
obtained without and with intravenous contrast.
CONTRAST:  10mL GADAVIST GADOBUTROL 1 MMOL/ML IV SOLN

[Series 5: T2 · sagittal · 4.0mm · 0.73mm/px · 5 of 16 slices shown (1 of 2)]
[im 1/16]
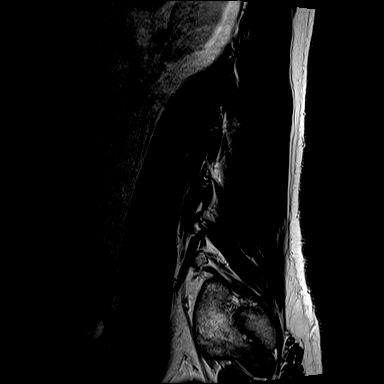
[im 4/16]
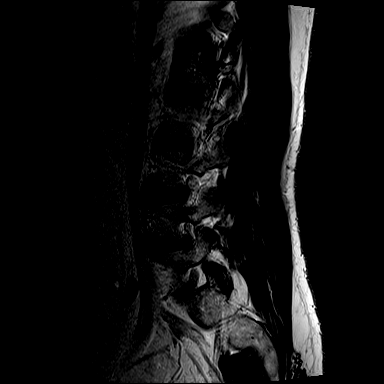
[im 8/16]
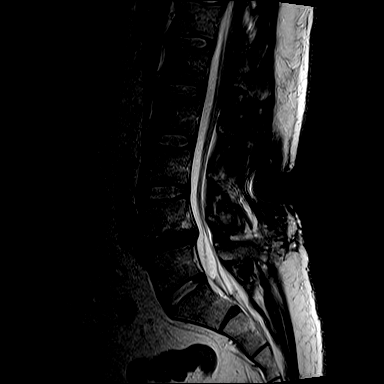
[im 12/16]
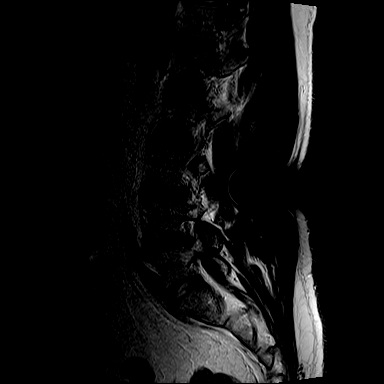
[im 16/16]
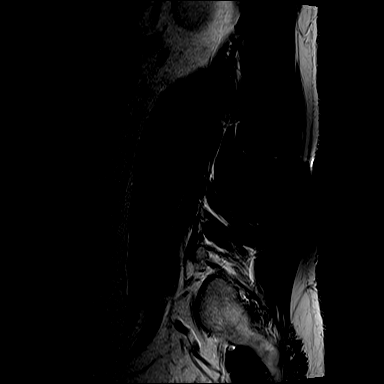

[Series 7: T1 · sagittal · 4.0mm · 0.88mm/px · 5 of 16 slices shown (1 of 2)]
[im 1/16]
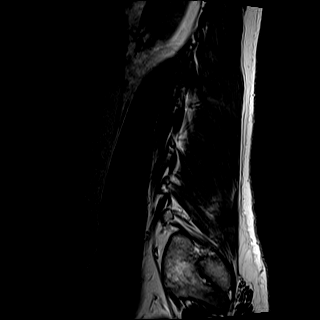
[im 4/16]
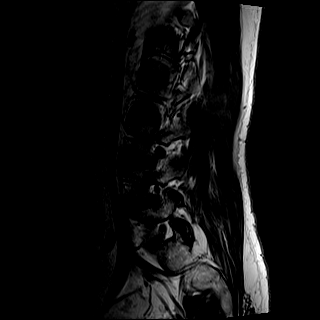
[im 8/16]
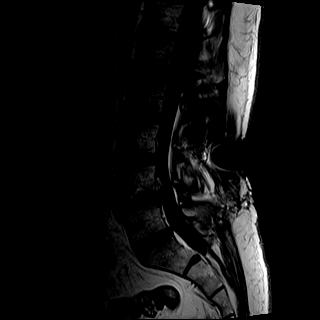
[im 12/16]
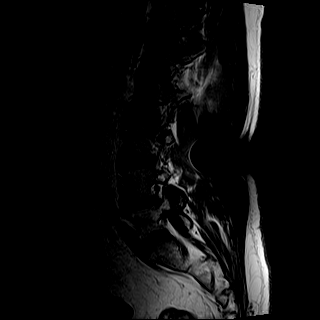
[im 16/16]
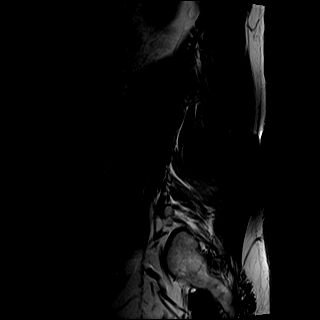

[Series 8: T2 · axial · 4.0mm · 0.57mm/px · z∈[-182,+24]mm · 9 of 33 slices shown (2 of 2)]
[im 1/33]
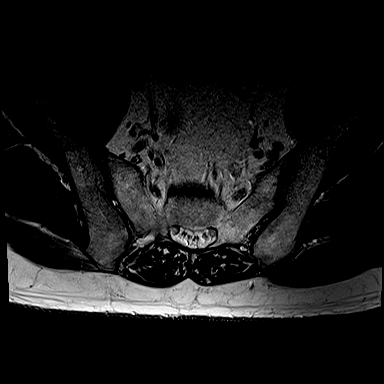
[im 5/33]
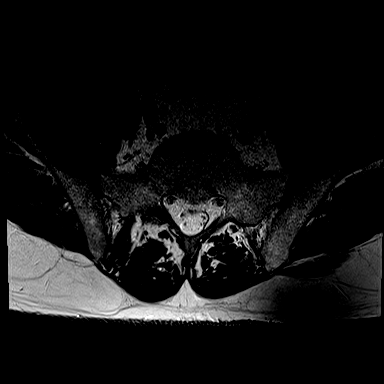
[im 9/33]
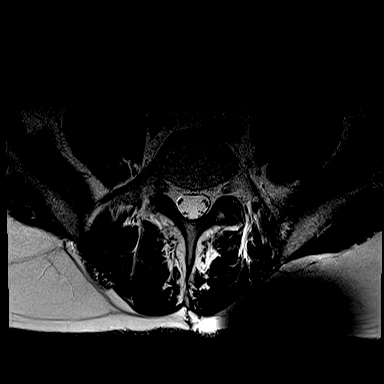
[im 13/33]
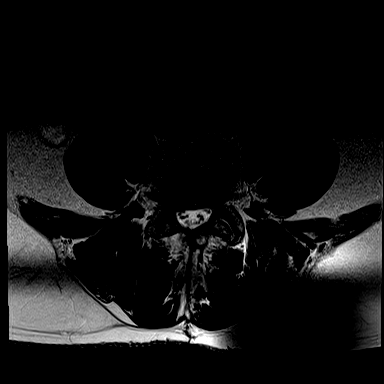
[im 17/33]
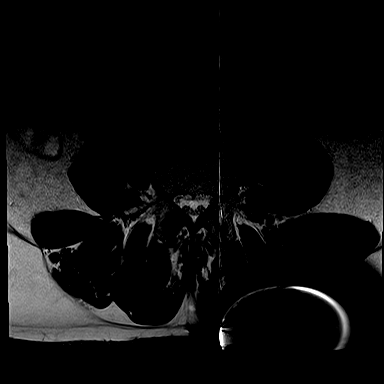
[im 21/33]
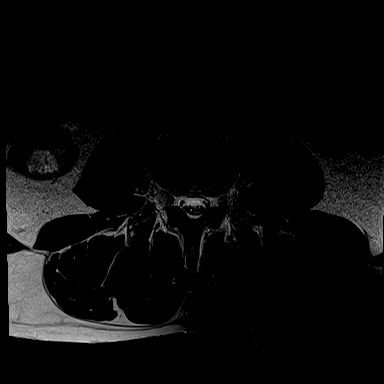
[im 25/33]
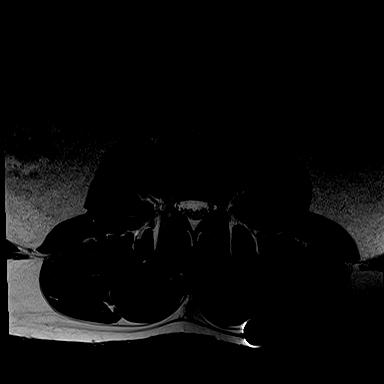
[im 29/33]
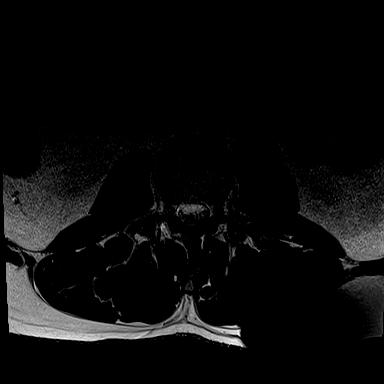
[im 33/33]
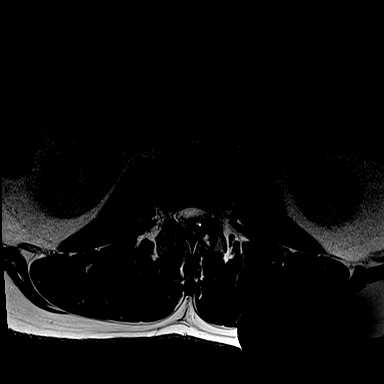

[Series 9: T1 · axial · 4.0mm · 0.34mm/px · z∈[-152,+38]mm · 6 of 33 slices shown (2 of 2)]
[im 1/33]
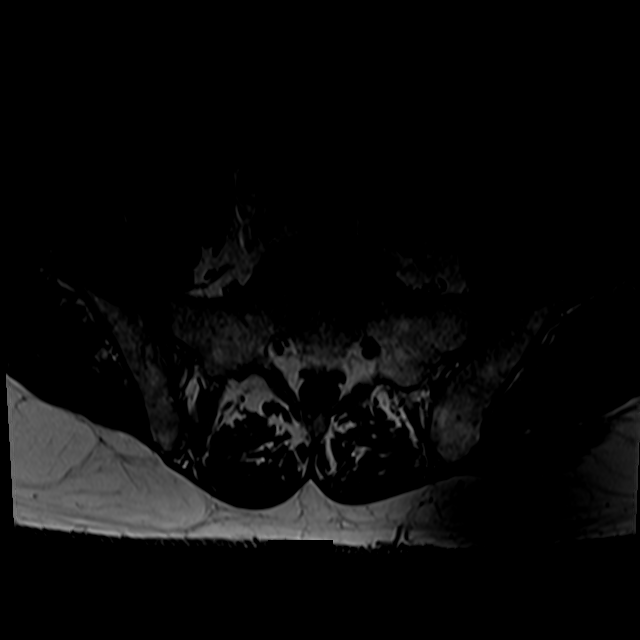
[im 5/33]
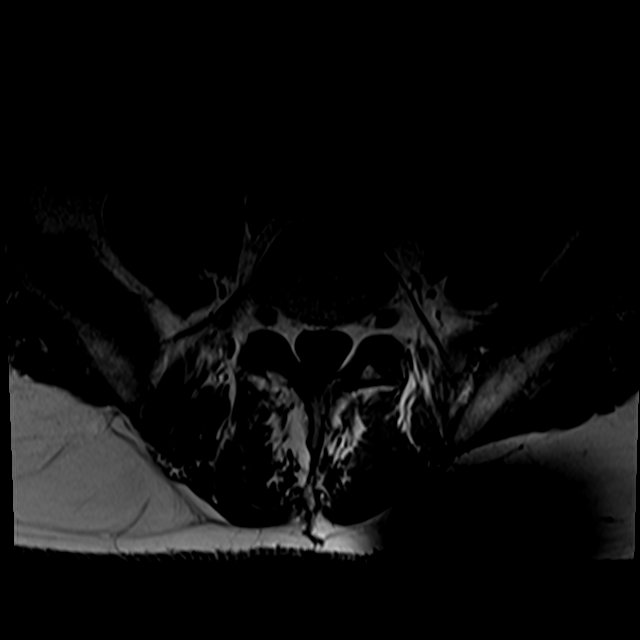
[im 9/33]
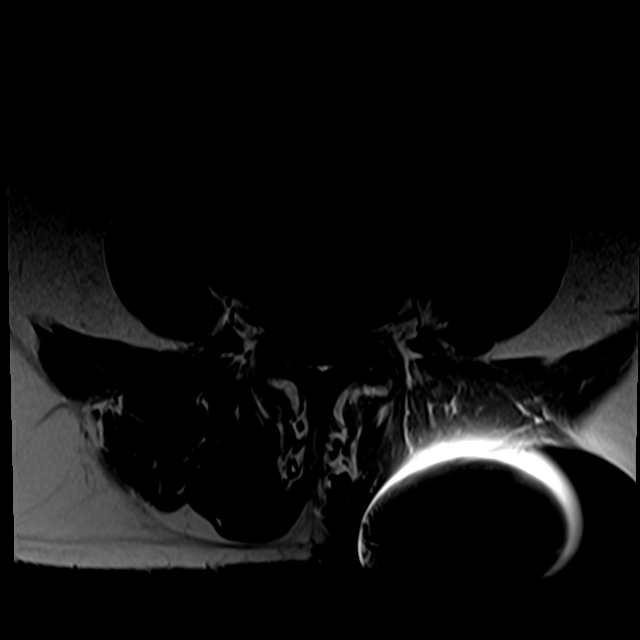
[im 13/33]
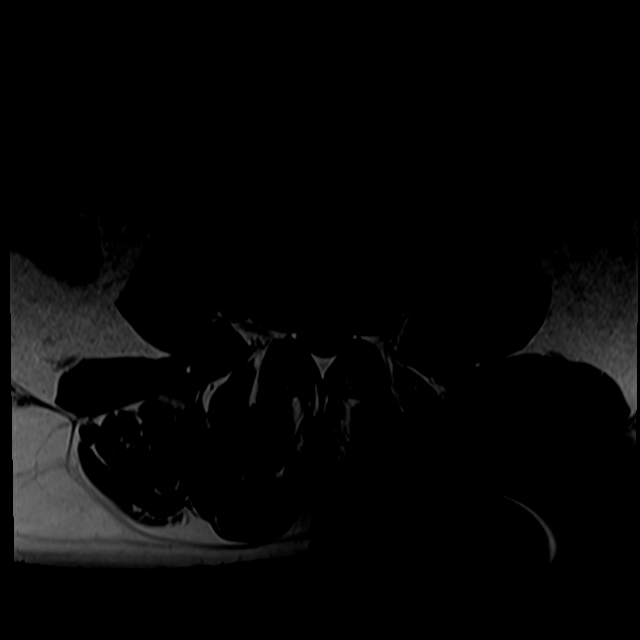
[im 17/33]
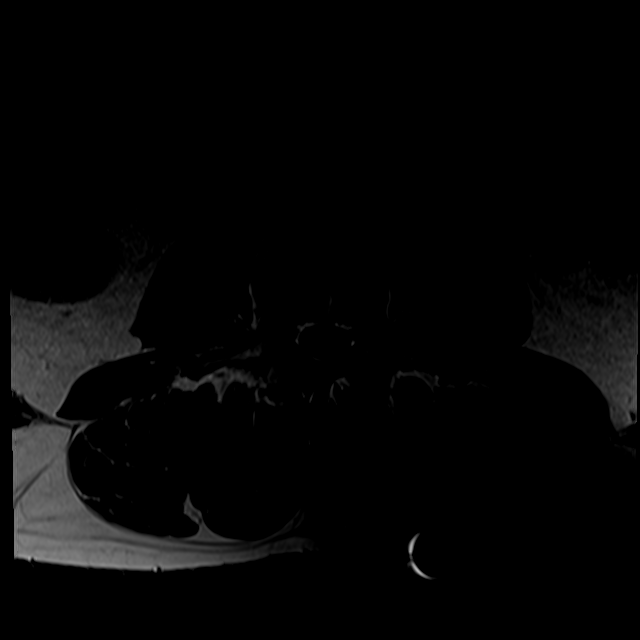
[im 29/33]
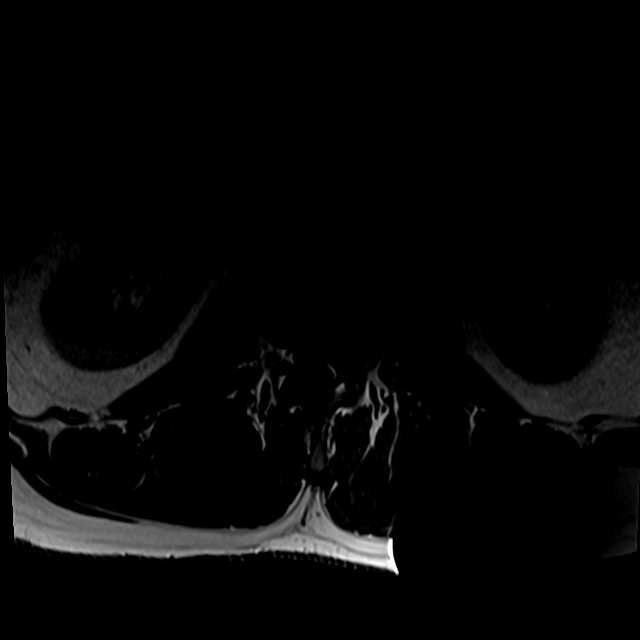

[25 of 48 positions shown; findings below may reference images not displayed]

FINDINGS: Segmentation:  Standard.

Alignment:  Physiologic.

Vertebrae:  No fracture, evidence of discitis, or bone lesion.

Conus medullaris and cauda equina: Conus extends to the L1-2 level.
Thoracic spinal cord stimulator wires adjacent to the thecal sac at
that level. Conus and cauda equina appear normal.

Paraspinal and other soft tissues: Negative.

Disc levels:

L1-2 and L2-3: Normal.

L3-4: Normal.

L4-5: Interval removal of the prominent central disc protrusion seen
on the prior study. Residual scar tissue anterior to the thecal sac
at that level. No focal neural impingement. The thecal sac is
slightly compressed by the scar tissue anteriorly. No focal neural
impingement.

L5-S1: Normal.
FINDINGS: Segmentation:  Standard.

Alignment:  Physiologic.

Vertebrae:  No fracture, evidence of discitis, or bone lesion.

Conus medullaris and cauda equina: Conus extends to the L1-2 level.
Thoracic spinal cord stimulator wires adjacent to the thecal sac at
that level. Conus and cauda equina appear normal.

Paraspinal and other soft tissues: Negative.

Disc levels:

L1-2 and L2-3: Normal.

L3-4: Normal.

L4-5: Interval removal of the prominent central disc protrusion seen
on the prior study. Residual scar tissue anterior to the thecal sac
at that level. No focal neural impingement. The thecal sac is
slightly compressed by the scar tissue anteriorly. No focal neural
impingement.

L5-S1: Normal.
IMPRESSION: 1. Interval removal of the prominent central disc protrusion at L4-5
with residual scar tissue anterior to the thecal sac at that level
is slight compression of the sac but without focal neural
impingement.
2. Otherwise, normal exam.

## 2021-02-14 ENCOUNTER — Ambulatory Visit: Payer: Medicare Other | Admitting: Student in an Organized Health Care Education/Training Program

## 2021-02-14 ENCOUNTER — Other Ambulatory Visit: Payer: Self-pay | Admitting: Student in an Organized Health Care Education/Training Program

## 2021-02-14 ENCOUNTER — Ambulatory Visit
Admission: RE | Admit: 2021-02-14 | Discharge: 2021-02-14 | Disposition: A | Discharge status: Home / Self Care | Payer: Medicare Other | Source: Ambulatory Visit | Attending: Student in an Organized Health Care Education/Training Program | Admitting: Student in an Organized Health Care Education/Training Program

## 2021-02-14 ENCOUNTER — Encounter: Payer: Self-pay | Admitting: Student in an Organized Health Care Education/Training Program

## 2021-02-14 ENCOUNTER — Other Ambulatory Visit: Payer: Self-pay

## 2021-02-14 VITALS — BP 146/80 | HR 85 | Temp 97.3°F | Resp 16 | Ht 69.0 in | Wt 220.0 lb

## 2021-02-14 DIAGNOSIS — M5416 Radiculopathy, lumbar region: Secondary | ICD-10-CM | POA: Insufficient documentation

## 2021-02-14 DIAGNOSIS — F1290 Cannabis use, unspecified, uncomplicated: Secondary | ICD-10-CM

## 2021-02-14 DIAGNOSIS — Z87891 Personal history of nicotine dependence: Secondary | ICD-10-CM | POA: Insufficient documentation

## 2021-02-14 DIAGNOSIS — G8929 Other chronic pain: Secondary | ICD-10-CM | POA: Insufficient documentation

## 2021-02-14 DIAGNOSIS — M5136 Other intervertebral disc degeneration, lumbar region: Secondary | ICD-10-CM | POA: Insufficient documentation

## 2021-02-14 DIAGNOSIS — Z9689 Presence of other specified functional implants: Secondary | ICD-10-CM | POA: Insufficient documentation

## 2021-02-14 DIAGNOSIS — M5126 Other intervertebral disc displacement, lumbar region: Secondary | ICD-10-CM

## 2021-02-14 DIAGNOSIS — G894 Chronic pain syndrome: Secondary | ICD-10-CM

## 2021-02-14 DIAGNOSIS — M5116 Intervertebral disc disorders with radiculopathy, lumbar region: Secondary | ICD-10-CM | POA: Insufficient documentation

## 2021-02-14 DIAGNOSIS — M961 Postlaminectomy syndrome, not elsewhere classified: Secondary | ICD-10-CM | POA: Insufficient documentation

## 2021-02-14 DIAGNOSIS — M546 Pain in thoracic spine: Secondary | ICD-10-CM | POA: Insufficient documentation

## 2021-02-14 DIAGNOSIS — I10 Essential (primary) hypertension: Secondary | ICD-10-CM | POA: Insufficient documentation

## 2021-02-14 DIAGNOSIS — M47814 Spondylosis without myelopathy or radiculopathy, thoracic region: Secondary | ICD-10-CM | POA: Diagnosis not present

## 2021-02-14 DIAGNOSIS — M5441 Lumbago with sciatica, right side: Secondary | ICD-10-CM

## 2021-02-14 DIAGNOSIS — E785 Hyperlipidemia, unspecified: Secondary | ICD-10-CM | POA: Diagnosis not present

## 2021-02-14 MED ORDER — KETOROLAC TROMETHAMINE 30 MG/ML IJ SOLN
30.0000 mg | Freq: Once | INTRAMUSCULAR | Status: AC
  Administered 2021-02-14: 30 mg via INTRAMUSCULAR
  Filled 2021-02-14: qty 1

## 2021-02-14 NOTE — Progress Notes (Signed)
PROVIDER NOTE: Information contained herein reflects review and annotations entered in association with encounter. Interpretation of such information and data should be left to medically-trained personnel. Information provided to patient can be located elsewhere in the medical record under "Patient Instructions". Document created using STT-dictation technology, any transcriptional errors that may result from process are unintentional.    Patient: Eugene Huerta  Service Category: E/M  Provider: Gillis Santa, MD  DOB: Mar 10, 1975  DOS: 02/14/2021  Specialty: Interventional Pain Management  MRN: [id number]  Setting: Ambulatory outpatient  PCP: Eugene Closs, PA-C  Type: Established Patient    Referring Provider: Hughie Closs, PA-C  Location: Office  Delivery: Face-to-face     HPI  Mr. Eugene Huerta, a 45 y.o. year old male, is here today because of his Chronic pain syndrome [G89.4]. Mr. Eugene Huerta primary complain today is Back Pain (lower) Last encounter: My last encounter with him was on 04/30/19 Pertinent problems: Mr. Eugene Huerta has DDD (degenerative disc disease), lumbar; Chronic midline low back pain with right-sided sciatica; Lumbar herniated disc; Failed back surgical syndrome; Lumbar post-laminectomy syndrome; and Marijuana use on their pertinent problem list. Pain Assessment: Severity of Chronic pain is reported as a 6 /10. Location: Back Lower/both legs to the calf. Onset: More than a month ago. Quality: Aching, Dull, Sharp. Timing: Constant. Modifying factor(s): heat, lying down. Vitals:  height is '5\' 9"'  (1.753 m) and weight is 220 lb (99.8 kg). His temporal temperature is 97.3 F (36.3 C) (abnormal). His blood pressure is 146/80 (abnormal) and his pulse is 85. His respiration is 16 and oxygen saturation is 98%.   Reason for encounter:   Eugene Huerta's last visit with me was 04/30/2019.  At that time it was instructed to him to wean off of his opioid medications.  He was on  high-dose opioid therapy at [facility] and agreed to [facility] patch at that time.  He has weaned himself off and he states that that was very difficult.  He is dealing with increased lower lumbar pain.  He does have occasional radiation into his legs worse on the right side.  He has a Nevro spinal cord stimulator in place.  He states that it is no longer effective.  He has not reached out to a Nevro spinal cord stimulator representative.  He does utilize marijuana to help manage his pain he states that it helps relaxes lumbar paraspinal muscle spasms.  He is going to the gym more often and also states that he has been sleeping better now that he is off of his opioid medications.  He is having increased pain today discussed intramuscular Toradol.  Instructed to refrain from any NSAIDs for the next 3 days.  He is also complaining of interscapular pain that is pronounced in his thoracic region.  This is new.  We will work this up.  ROS  Constitutional: Denies any fever or chills Gastrointestinal: No reported hemesis, hematochezia, vomiting, or acute GI distress Musculoskeletal:  Midthoracic, interscapular, low back pain with occasional radiation into right lower extremity Neurological: No reported episodes of acute onset apraxia, aphasia, dysarthria, agnosia, amnesia, paralysis, loss of coordination, or loss of consciousness  Medication Review  DULoxetine, amphetamine-dextroamphetamine, buprenorphine, gabapentin, lisinopril, and simvastatin  History Review  Allergy: Mr. Eugene Huerta has No Known Allergies. Drug: Mr. Eugene Huerta  reports no history of drug use. Alcohol:  reports no history of alcohol use. Tobacco:  reports that he has quit smoking. He has never used smokeless tobacco. Social: Mr. Eugene Huerta  reports that  he has quit smoking. He has never used smokeless tobacco. He reports that he does not drink alcohol and does not use drugs. Medical:  has a past medical history of Hyperlipidemia and  Hypertension. Surgical: Mr. Eugene Huerta  has a past surgical history that includes Back surgery; Hernia repair; Appendectomy; and Hand tendon surgery. Family: family history includes Hypertension in his father.  Laboratory Chemistry Profile   Renal Lab Results  Component Value Date   BUN 15 09/09/2015   CREATININE 0.89 03/19/2019   BCR 14 09/09/2015   GFRAA >60 03/19/2019   GFRNONAA >60 03/19/2019    Hepatic Lab Results  Component Value Date   ALBUMIN 4.2 09/09/2015    Electrolytes Lab Results  Component Value Date   NA 144 09/09/2015   K 5.2 09/09/2015   CL 100 09/09/2015   CALCIUM 9.7 09/09/2015   PHOS 4.5 09/09/2015    Bone No results found for: VD25OH, VD125OH2TOT, HU7654YT0, PT4656CL2, 25OHVITD1, 25OHVITD2, 25OHVITD3, TESTOFREE, TESTOSTERONE  Inflammation (CRP: Acute Phase) (ESR: Chronic Phase) No results found for: CRP, ESRSEDRATE, LATICACIDVEN       Note: Above Lab results reviewed.  Recent Imaging Review  MR LUMBAR SPINE W WO CONTRAST CLINICAL DATA:  Low back pain and bilateral leg pain, right greater than left.  EXAM: MRI LUMBAR SPINE WITHOUT AND WITH CONTRAST  TECHNIQUE: Multiplanar and multiecho pulse sequences of the lumbar spine were obtained without and with intravenous contrast.  CONTRAST:  73m GADAVIST GADOBUTROL 1 MMOL/ML IV SOLN  COMPARISON:  MRI dated 09/22/2013  FINDINGS: Segmentation:  Standard.  Alignment:  Physiologic.  Vertebrae:  No fracture, evidence of discitis, or bone lesion.  Conus medullaris and cauda equina: Conus extends to the L1-2 level. Thoracic spinal cord stimulator wires adjacent to the thecal sac at that level. Conus and cauda equina appear normal.  Paraspinal and other soft tissues: Negative.  Disc levels:  L1-2 and L2-3: Normal.  L3-4: Normal.  L4-5: Interval removal of the prominent central disc protrusion seen on the prior study. Residual scar tissue anterior to the thecal sac at that level. No focal  neural impingement. The thecal sac is slightly compressed by the scar tissue anteriorly. No focal neural impingement.  L5-S1: Normal.  CLINICAL DATA:  Low back pain and bilateral leg pain, right greater than left.  EXAM: MRI LUMBAR SPINE WITHOUT AND WITH CONTRAST  TECHNIQUE: Multiplanar and multiecho pulse sequences of the lumbar spine were obtained without and with intravenous contrast.  CONTRAST:  160mGADAVIST GADOBUTROL 1 MMOL/ML IV SOLN  COMPARISON:  MRI dated 09/22/2013  FINDINGS: Segmentation:  Standard.  Alignment:  Physiologic.  Vertebrae:  No fracture, evidence of discitis, or bone lesion.  Conus medullaris and cauda equina: Conus extends to the L1-2 level. Thoracic spinal cord stimulator wires adjacent to the thecal sac at that level. Conus and cauda equina appear normal.  Paraspinal and other soft tissues: Negative.  Disc levels:  L1-2 and L2-3: Normal.  L3-4: Normal.  L4-5: Interval removal of the prominent central disc protrusion seen on the prior study. Residual scar tissue anterior to the thecal sac at that level. No focal neural impingement. The thecal sac is slightly compressed by the scar tissue anteriorly. No focal neural impingement.  L5-S1: Normal.  IMPRESSION: 1. Interval removal of the prominent central disc protrusion at L4-5 with residual scar tissue anterior to the thecal sac at that level is slight compression of the sac but without focal neural impingement. 2. Otherwise, normal exam.  Electronically Signed  By: Lorriane Shire M.D.   On: 03/19/2019 14:53 Note: Reviewed        Physical Exam  General appearance: alert, cooperative, and in mild distress Mental status: Alert, oriented x 3 (person, place, & time)       Respiratory: No evidence of acute respiratory distress Eyes: PERLA Vitals: BP (!) 146/80    Pulse 85    Temp (!) 97.3 F (36.3 C) (Temporal)    Resp 16    Ht '5\' 9"'  (1.753 m)    Wt 220 lb (99.8 kg)    SpO2 98%     BMI 32.49 kg/m  BMI: Estimated body mass index is 32.49 kg/m as calculated from the following:   Height as of this encounter: '5\' 9"'  (1.753 m).   Weight as of this encounter: 220 lb (99.8 kg). Ideal: Ideal body weight: 70.7 kg (155 lb 13.8 oz) Adjusted ideal body weight: 82.3 kg (181 lb 8.3 oz)  Thoracic Spine Area Exam  Skin & Axial Inspection: No masses, redness, or swelling Alignment: Symmetrical Functional ROM: Unrestricted ROM Stability: No instability detected Muscle Tone/Strength: Functionally intact. No obvious neuro-muscular anomalies detected. Sensory (Neurological): Musculoskeletal pain pattern, interscapular between T6-T7 Muscle strength & Tone: No palpable anomalies Lumbar Spine Area Exam  Skin & Axial Inspection: Well healed scar from previous spine surgery detected Alignment: Symmetrical Functional ROM: Pain restricted ROM affecting both sides Stability: No instability detected Muscle Tone/Strength: Functionally intact. No obvious neuro-muscular anomalies detected. Sensory (Neurological): Dermatomal pain pattern  Nevro IPG present  Gait & Posture Assessment  Ambulation: Limited Gait: Relatively normal for age and body habitus Posture: Difficulty standing up straight, due to pain  Lower Extremity Exam    Side: Right lower extremity  Side: Left lower extremity  Stability: No instability observed          Stability: No instability observed          Skin & Extremity Inspection: Skin color, temperature, and hair growth are WNL. No peripheral edema or cyanosis. No masses, redness, swelling, asymmetry, or associated skin lesions. No contractures.  Skin & Extremity Inspection: Skin color, temperature, and hair growth are WNL. No peripheral edema or cyanosis. No masses, redness, swelling, asymmetry, or associated skin lesions. No contractures.  Functional ROM: Unrestricted ROM                  Functional ROM: Unrestricted ROM                  Muscle Tone/Strength:  Functionally intact. No obvious neuro-muscular anomalies detected.  Muscle Tone/Strength: Functionally intact. No obvious neuro-muscular anomalies detected.  Sensory (Neurological): Neurogenic pain pattern        Sensory (Neurological): Neurogenic pain pattern        DTR: Patellar: deferred today Achilles: deferred today Plantar: deferred today  DTR: Patellar: deferred today Achilles: deferred today Plantar: deferred today  Palpation: No palpable anomalies  Palpation: No palpable anomalies    Assessment   Status Diagnosis  Having a Flare-up Having a Flare-up Having a Flare-up 1. Chronic pain syndrome   2. Discogenic thoracic pain   3. Lumbar radiculopathy, chronic   4. Spinal cord stimulator status   5. DDD (degenerative disc disease), lumbar   6. Chronic midline low back pain with right-sided sciatica   7. Lumbar herniated disc   8. Failed back surgical syndrome   9. Lumbar post-laminectomy syndrome   10. Marijuana use      Updated Problems: Problem  Failed Back Surgical  Syndrome  Lumbar Post-Laminectomy Syndrome  Marijuana Use  Chronic Midline Low Back Pain With Right-Sided Sciatica  Ddd (Degenerative Disc Disease), Lumbar  Lumbar Herniated Disc    Plan of Care    Mr. Tyke Outman has a current medication list which includes the following long-term medication(s): lisinopril, simvastatin, amphetamine-dextroamphetamine, duloxetine, and gabapentin.   1.  Intramuscular Norflex today given increase lumbar spine pain. 2.  Obtain thoracic and lumbar spine x-ray to evaluate thoracic interscapular pain and to ensure spinal cord stimulator leads are intact and in correct location. 3.  Encourage patient to reach out to [facility] spinal cord stimulation to optimize programming 4.  We will call patient with x-ray results and to discuss treatment plan which may include additional work-up in the form of MRI.   Pharmacotherapy (Medications Ordered): Meds ordered this  encounter  Medications   ketorolac (TORADOL) 30 MG/ML injection 30 mg   Orders:  Orders Placed This Encounter  Procedures   DG Lumbar Spine Complete W/Bend    Patient presents with axial pain with possible radicular component. Please assist [place] in identifying specific level(s) and laterality of any additional findings such as: 1. Facet (Zygapophyseal) joint DJD (Hypertrophy, space narrowing, subchondral sclerosis, and/or osteophyte formation) 2. DDD and/or IVDD (Loss of disc height, desiccation, gas patterns, osteophytes, endplate sclerosis, or "Black disc disease") 3. Pars defects 4. Spondylolisthesis, spondylosis, and/or spondyloarthropathies (include Degree/Grade of displacement in mm) (stability) 5. Vertebral body Fractures (acute/chronic) (state percentage of collapse) 6. Demineralization (osteopenia/osteoporotic) 7. Bone pathology 8. Foraminal narrowing  9. Surgical changes    Standing Status:   Future    Number of Occurrences:   1    Standing Expiration Date:   03/17/2021    Scheduling Instructions:     Imaging must be done as soon as possible. Inform patient that order will expire within 30 days and I will not renew it.    Order Specific Question:   Reason for Exam (SYMPTOM  OR DIAGNOSIS REQUIRED)    Answer:   Low back pain    Order Specific Question:   Preferred imaging location?    Answer:   [facility]    Order Specific Question:   Call Results- Best Contact Number?    Answer:   [phone] ([facility])    Order Specific Question:   Radiology Contrast Protocol - do NOT remove file path    Answer:   [email or web address]    Order Specific Question:   Release to patient    Answer:   Immediate   DG Thoracic Spine 4V    Patient presents with axial pain with possible radicular component. Please assist [place] in identifying specific level(s) and laterality of any additional findings such as: 1. Facet (Zygapophyseal) joint DJD  (Hypertrophy, space narrowing, subchondral sclerosis, and/or osteophyte formation) 2. DDD and/or IVDD (Loss of disc height, desiccation, gas patterns, osteophytes, endplate sclerosis, or "Black disc disease") 3. Pars defects 4. Spondylolisthesis, spondylosis, and/or spondyloarthropathies (include Degree/Grade of displacement in mm) (stability) 5. Vertebral body Fractures (acute/chronic) (state percentage of collapse) 6. Demineralization (osteopenia/osteoporotic) 7. Bone pathology 8. Foraminal narrowing  9. Surgical changes    Standing Status:   Future    Number of Occurrences:   1    Standing Expiration Date:   05/15/2021    Order Specific Question:   Reason for Exam (SYMPTOM  OR DIAGNOSIS REQUIRED)    Answer:   Upper back pain and/or thoracic spine pain.    Order Specific Question:   Preferred  imaging location?    Answer:   [facility]    Order Specific Question:   Call Results- Best Contact Number?    Answer:   [phone] [facility])   Follow-up plan:   Return for will call patient with results from xray results.    Recent Visits No visits were found meeting these conditions. Showing recent visits within past 90 days and meeting all other requirements Today's Visits Date Type Provider Dept  02/14/21 Office Visit Eugene Santa, MD [facility]  Showing today's visits and meeting all other requirements Future Appointments No visits were found meeting these conditions. Showing future appointments within next 90 days and meeting all other requirements I discussed the assessment and treatment plan with the patient. The patient was provided an opportunity to ask questions and all were answered. The patient agreed with the plan and demonstrated an understanding of the instructions.  Patient advised to call back or seek an in-person evaluation if the symptoms or condition worsens.  Duration of encounter: 48mnutes.  Note by: BGillis Santa MD Date: 02/14/2021;  Time: 11:15 AM

## 2021-02-14 NOTE — Progress Notes (Signed)
Safety precautions to be maintained throughout the outpatient stay will include: orient to surroundings, keep bed in low position, maintain call bell within reach at all times, provide assistance with transfer out of bed and ambulation.  

## 2021-04-24 NOTE — Progress Notes (Signed)
 [place] Progress Note  Chief Complaint:   Chief Complaint  Patient presents with  . Hypertension  . Hyperlipidemia   Subjective:   Eugene Huerta is a 46 y.o. male established patient. Video visit today for:  Hypertension Follow Up: Patient is here to follow up of hypertension.  Feels this problems is controlled  Current medications: lisinopril  10mg  daily Taking medications as prescribed? Yes Medication side effects? No Denies: chest pain on exertion, chest pain at rest, palpitations and syncope  Diet: last 4 weeks, started diet with nutritionist, counting macros and healthy heart diet. Sausage, egg whites, spinach scrambles, protein with vegetables, protein with sweet potatoes, protein shake with unsweetened almond milk with fruit, yogurt with fruit. Exercise: 3-4 days a week- about hour, cardio/strength training Goal is to be back 185-190lb Has family hx of cardiac disease Last BP check was 130s/80s  Social History   Tobacco Use  Smoking Status Former  . Packs/day: 0.25  . Years: 3.00  . Pack years: 0.75  . Types: Cigarettes  . Quit date: 10/14/2011  . Years since quitting: 9.5  Smokeless Tobacco Never   BP Readings from Last 3 Encounters:  04/24/21 119/75  04/21/20 135/84  12/29/18 148/74   Hyperlipidemia: Follow-Up: Pt feels this problem is: controlled Current symptoms: none Patient denies exertional chest pain, dyspnea, palpitations, syncope, orthopnea, edema and or paroxysmal nocturnal dyspnea  Current medication: simvastatin  40mg  daily Medication side effects? none  Lipid:  Lab Results  Component Value Date   CHOLTOTAL 176 04/21/2020   Lab Results  Component Value Date   HDL 37 04/21/2020   Lab Results  Component Value Date   LDLCALC 114 04/21/2020   Lab Results  Component Value Date   TRIG 127 04/21/2020   Last GFR: Lab Results  Component Value Date   GFR 92 04/21/2020   The 10-year ASCVD risk score (Arnett DK, et al., 2019) is: 2%    Values used to calculate the score:     Age: 23 years     Sex: Male     Is Non-Hispanic African American: No     Diabetic: No     Tobacco smoker: No     Systolic Blood Pressure: 119 mmHg     Is BP treated: Yes     HDL Cholesterol: 37 mg/dL     Total Cholesterol: 176 mg/dL  Patient Active Problem List  Diagnosis  . Elevated LDL cholesterol level  . Lumbar herniated disc  . Lower back pain  . Attention deficit hyperactivity disorder (ADHD), predominantly inattentive type  . 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 Lumbar radiculopathy, chronic  . Essential hypertension  . Spinal cord stimulator status  . Chronic midline low back pain with right-sided sciatica  . OSA (obstructive sleep apnea)  . Chronic pain syndrome  . Encounter for long-term opiate analgesic use  . Encounter for monitoring opioid maintenance therapy  . OSA on CPAP    Outpatient Medications Prior to Visit  Medication Sig Dispense Refill  . lisinopriL  (ZESTRIL ) 10 MG tablet Take 1 tablet (10 mg total) by mouth once daily 90 tablet 3  . simvastatin  (ZOCOR ) 40 MG tablet Take 1 tablet (40 mg total) by mouth nightly 90 tablet 3  . DULoxetine (CYMBALTA) 60 MG DR capsule Take 1 capsule (60 mg total) by mouth once daily (Patient not taking: Reported on 04/24/2021) 90 capsule 3   No facility-administered medications prior to visit.   ROS as per HPI  Objective:   Vitals as reported by patient: Vitals:   04/24/21 1056  BP: 119/75  Pulse: 76  Weight: 98.7 kg (217 lb 9.6 oz)  PainSc:   5  PainLoc: Back   Body mass index is 31.22 kg/m.  Constitutional: alert, interactive with provider, cooperative, in no distress Mental status: oriented x 3, good historian, appropriate mood and behavior, thought content appears normal Respiratory: no respiratory distress, no audible wheezing  Assessment/Plan:  Diagnoses and all orders for this visit:  Essential hypertension Established  problem. Controlled- initial BP was elevated, however improved on re-check. Continue current management. Discussed diet and exercise and encouraged for changes made to diet. -     Complete Blood Count (CBC) -     Comprehensive Metabolic Panel (CMP)  Mixed hyperlipidemia Established problem. Controlled. Continue current management. -     Lipid Panel W/Reflex Direct Low Density Lipoprotein (LDL) Cholesterol  Family history of cardiovascular disorder -     Hemoglobin A1C  Family history of colonic polyps Patient states that his father has had pre-cancerous polyps and about 19-20 removed at the same time. -     Ambulatory Referral to Colonoscopy  This visit was coded based on medical decision making (MDM).   Return in about 6 months (around 10/22/2021) for hypertension, HLD.  Future Appointments    Date/Time Provider Department Center Visit Type   10/23/2021 11:00 AM (Arrive by 10:45 AM) Perri Constance Sor, PA [facility] [place] [facility] OFFICE VISIT

## 2021-07-08 ENCOUNTER — Encounter: Payer: Self-pay | Admitting: Emergency Medicine

## 2021-07-08 ENCOUNTER — Ambulatory Visit
Admission: EM | Admit: 2021-07-08 | Discharge: 2021-07-08 | Disposition: A | Discharge status: Home / Self Care | Payer: Medicare Other | Attending: Emergency Medicine | Admitting: Emergency Medicine

## 2021-07-08 DIAGNOSIS — J029 Acute pharyngitis, unspecified: Secondary | ICD-10-CM | POA: Diagnosis not present

## 2021-07-08 DIAGNOSIS — H1033 Unspecified acute conjunctivitis, bilateral: Secondary | ICD-10-CM

## 2021-07-08 LAB — POCT RAPID STREP A (OFFICE): Rapid Strep A Screen: NEGATIVE

## 2021-07-08 MED ORDER — POLYMYXIN B-TRIMETHOPRIM 10000-0.1 UNIT/ML-% OP SOLN: 1.0000 [drp] | Freq: Four times a day (QID) | OPHTHALMIC | 0 refills | Status: DC

## 2021-07-08 MED ORDER — POLYMYXIN B-TRIMETHOPRIM 10000-0.1 UNIT/ML-% OP SOLN
1.0000 [drp] | Freq: Four times a day (QID) | OPHTHALMIC | 0 refills | Status: AC
Start: 2021-07-08 — End: 2021-07-15

## 2021-07-08 NOTE — ED Triage Notes (Addendum)
Pt here with bilateral eye irritation and redness since yesterday.  ?

## 2021-07-08 NOTE — ED Provider Notes (Signed)
?[facility] ? ? ? ?CSN: [id number] ?Arrival date & time: 07/08/21  0932 ? ? ?  ? ?History   ?Chief Complaint ?Chief Complaint  ?Patient presents with  ? Eye Problem  ? ? ?HPI ?Eugene Huerta is a 46 y.o. male.  Patient presents with 1 day history of bilateral eye redness, itching, crusting in lashes, yellow mucus in corners.  No eye injury, acute eye pain, changes in vision.  He also reports postnasal drip and sore throat.  No fever, chills, rash, cough, shortness of breath, vomiting, diarrhea, or other symptoms.  Treatment at home with ibuprofen.  His daughter recently had strep throat.  His medical history includes hypertension, hyperlipidemia, ADHD, degenerative disc disease, lumbar radiculopathy, lumbar herniated disc, chronic pain, obstructive sleep apnea. ? ?The history is provided by the patient and medical records.  ? ?Past Medical History:  ?Diagnosis Date  ? Hyperlipidemia   ? Hypertension   ? ? ?Patient Active Problem List  ? Diagnosis Date Noted  ? 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 Lumbar post-laminectomy syndrome 02/05/2019  ? Marijuana use 02/05/2019  ? Chronic pain syndrome 07/03/2017  ? Obstructive sleep apnea (adult) (pediatric) 08/28/2016  ? Spinal cord stimulator status 06/29/2016  ? Chronic midline low back pain with right-sided sciatica 06/29/2016  ? Lumbar radiculopathy, chronic 06/20/2016  ? Hyperlipidemia 10/13/2015  ? Essential hypertension 10/13/2015  ? Sleep apnea 10/13/2015  ? 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 Attention deficit disorder with hyperactivity 08/06/2014  ? Benign hypertension 08/06/2014  ? DDD (degenerative disc disease), lumbar 08/06/2014  ? Lower back pain 09/30/2013  ? Lumbar herniated disc 09/30/2013  ? ? ?Past Surgical History:  ?Procedure Laterality Date  ? APPENDECTOMY    ? BACK SURGERY    ? x 2  ? HAND TENDON SURGERY    ? x 2  ? HERNIA REPAIR    ? ? ? ? ? ?Home Medications   ? ?Prior to Admission medications    ?Medication Sig Start Date End Date Taking? Authorizing Provider  ?amphetamine-dextroamphetamine (ADDERALL XR) 20 MG 24 hr capsule Take 1 capsule (20 mg total) by mouth 1 day or 1 dose. Reported on 09/09/2015 ?Patient not taking: Reported on 02/14/2021 11/03/15   Duanne LimerickJones, Deanna C, MD  ?buprenorphine (BUTRANS) 10 MCG/HR PTWK patch 1 patch once a week. ?Patient not taking: Reported on 02/14/2021 12/29/18   [provider]  ?DULoxetine (CYMBALTA) 60 MG capsule Take 60 mg by mouth daily. Dr Bobbye Riggsunion- pain clinic ?Patient not taking: Reported on 02/14/2021    [provider]  ?gabapentin (NEURONTIN) 600 MG tablet Take 2 tablets (1,200 mg total) by mouth at bedtime. ?Patient not taking: Reported on 02/14/2021 04/30/19   Edward JollyLateef, Bilal, MD  ?lisinopril (PRINIVIL,ZESTRIL) 10 MG tablet Take 1 tablet (10 mg total) by mouth daily. 11/03/15   Duanne LimerickJones, Deanna C, MD  ?simvastatin (ZOCOR) 20 MG tablet Take 1 tablet (20 mg total) by mouth 1 day or 1 dose. Reported on 09/09/2015 11/03/15   Duanne LimerickJones, Deanna C, MD  ?trimethoprim-polymyxin b (POLYTRIM) ophthalmic solution Place 1 drop into both eyes 4 (four) times daily for 7 days. 07/08/21 07/15/21  Mickie Bailate, Ashwath Lasch H, NP  ? ? ?Family History ?Family History  ?Problem Relation Age of Onset  ? Hypertension Father   ? ? ?Social History ?Social History  ? ?Tobacco Use  ? Smoking status: Former  ? Smokeless tobacco: Never  ?Substance Use Topics  ? Alcohol use: No  ?  Alcohol/week: 0.0 standard  drinks  ? Drug use: No  ? ? ? ?Allergies   ?Denture adhesive ? ? ?Review of Systems ?Review of Systems  ?Constitutional:  Negative for chills and fever.  ?HENT:  Positive for postnasal drip and sore throat. Negative for ear pain.   ?Eyes:  Positive for discharge, redness and itching. Negative for pain and visual disturbance.  ?Respiratory:  Negative for cough and shortness of breath.   ?Cardiovascular:  Negative for chest pain and palpitations.  ?Gastrointestinal:  Negative for diarrhea and vomiting.   ?Skin:  Negative for color change and rash.  ?All other systems reviewed and are negative. ? ? ?Physical Exam ?Triage Vital Signs ?ED Triage Vitals  ?Enc Vitals Group  ?   BP 07/08/21 0946 124/82  ?   Pulse Rate 07/08/21 0946 67  ?   Resp 07/08/21 0946 18  ?   Temp 07/08/21 0946 98.2 ?F (36.8 ?C)  ?   Temp src --   ?   SpO2 07/08/21 0946 97 %  ?   Weight --   ?   Height --   ?   Head Circumference --   ?   Peak Flow --   ?   Pain Score 07/08/21 0949 2  ?   Pain Loc --   ?   Pain Edu? --   ?   Excl. in GC? --   ? ?No data found. ? ?Updated Vital Signs ?BP 124/82   Pulse 67   Temp 98.2 ?F (36.8 ?C)   Resp 18   SpO2 97%  ? ?Visual Acuity ?Right Eye Distance:   ?Left Eye Distance:   ?Bilateral Distance:   ? ?Right Eye Near:   ?Left Eye Near:    ?Bilateral Near:    ? ?Physical Exam ?Vitals and nursing note reviewed.  ?Constitutional:   ?   General: He is not in acute distress. ?   Appearance: Normal appearance. He is well-developed. He is not ill-appearing.  ?HENT:  ?   Right Ear: Tympanic membrane normal.  ?   Left Ear: Tympanic membrane normal.  ?   Nose: Nose normal.  ?   Mouth/Throat:  ?   Mouth: Mucous membranes are moist.  ?   Pharynx: Oropharynx is clear.  ?   Comments: Clear postnasal drip. ?Eyes:  ?   General: Lids are normal. Vision grossly intact.  ?   Extraocular Movements: Extraocular movements intact.  ?   Conjunctiva/sclera:  ?   Right eye: Right conjunctiva is injected.  ?   Left eye: Left conjunctiva is injected.  ?   Pupils: Pupils are equal, round, and reactive to light.  ?   Comments: Bilateral conjunctiva injected, right more than left.  ?Cardiovascular:  ?   Rate and Rhythm: Normal rate and regular rhythm.  ?   Heart sounds: Normal heart sounds.  ?Pulmonary:  ?   Effort: Pulmonary effort is normal. No respiratory distress.  ?   Breath sounds: Normal breath sounds.  ?Musculoskeletal:  ?   Cervical back: Neck supple.  ?Skin: ?   General: Skin is warm and dry.  ?Neurological:  ?   Mental Status:  He is alert.  ?Psychiatric:     ?   Mood and Affect: Mood normal.     ?   Behavior: Behavior normal.  ? ? ? ?UC Treatments / Results  ?Labs ?(all labs ordered are listed, but only abnormal results are displayed) ?Labs Reviewed  ?POCT RAPID STREP A (OFFICE)  ? ? ?  EKG ? ? ?Radiology ?No results found. ? ?Procedures ?Procedures (including critical care time) ? ?Medications Ordered in UC ?Medications - No data to display ? ?Initial Impression / Assessment and Plan / UC Course  ?I have reviewed the triage vital signs and the nursing notes. ? ?Pertinent labs & imaging results that were available during my care of the patient were reviewed by me and considered in my medical decision making (see chart for details). ? ?  ?Bilateral conjunctivitis, sore throat.  Rapid strep negative.  Discussed symptomatic treatment for sore throat.  Treating conjunctivitis with Polytrim eyedrops.  Education provided on bacterial conjunctivitis and sore throat.  Instructed patient to follow-up with his PCP if his symptoms are not improving.  He agrees to plan of care. ? ?Final Clinical Impressions(s) / UC Diagnoses  ? ?Final diagnoses:  ?Acute bacterial conjunctivitis of both eyes  ?Sore throat  ? ? ? ?Discharge Instructions   ? ?  ?Your strep test is negative.   ? ?Use the antibiotic eyedrops as prescribed.  Follow up with your primary care provider if your symptoms are not improving.   ? ? ? ? ? ? ?ED Prescriptions   ? ? Medication Sig Dispense Auth. Provider  ? trimethoprim-polymyxin b (POLYTRIM) ophthalmic solution  (Status: Discontinued) Place 1 drop into both eyes 4 (four) times daily for 7 days. 10 mL Mickie Bail, NP  ? trimethoprim-polymyxin b (POLYTRIM) ophthalmic solution Place 1 drop into both eyes 4 (four) times daily for 7 days. 10 mL Mickie Bail, NP  ? ?  ? ?PDMP not reviewed this encounter. ?  ?Mickie Bail, NP ?07/08/21 1033 ? ?

## 2021-07-08 NOTE — Discharge Instructions (Addendum)
Your strep test is negative.   ? ?Use the antibiotic eyedrops as prescribed.  Follow up with your primary care provider if your symptoms are not improving.   ? ? ?

## 2021-08-16 NOTE — Progress Notes (Signed)
 Chief Complaint:   Chief Complaint  Patient presents with  . Fatigue  . Erectile Dysfunction    Subjective:   Eugene Huerta is a 46 y.o. male established patient in today for: HPI:  Fatigue, low energy, and ED Patient presents with fatigue, low energy, and ED - would like testosterone checked. Symptoms began several months ago - about 6 months  Associated symptoms: decreased libido. ED - trouble achieving an erection.  Possible contributors: none that he can think of. Has been serious about weightlifting for a long time. He has been working with a nutritionist at his gym for a few months who is calculating his macros and has him on a very healthy diet. They are both surprised he has not lost much weight and his energy level continues to decline.  He denies previous mental health issues and changes in amount of work or family stress. Denies changes in sleep pattern. Sleeps regularly 7-8 hrs per night. Does have hx of chronic pain, but has been off opiates all together for past couple of years. Around the same time, shortly thereafter he got off his ADHD stimulant. A few months following, Cymbalta a few months later. All of these med changes preceded his fatigue and ED by at least six months  He started taking creatine about two weeks ago, but has not helped symptoms.  -Exercise: goes to the gym four days per week at least. Some cardio, mostly weights. -EtOH: 2-3 drinks per week. No other tobacco or substance use.  Patient denies fever, significant change in weight, symptoms of arthritis, unusual rashes, cold intolerance, constipation and change in hair texture., GI blood loss, and witnessed or suspected sleep apnea  Symptoms have stayed consistent, perhaps slightly worsened Severity has been moderate Previous visits for this problem: none Previous evaluation has included: none  Wt Readings from Last 10 Encounters:  08/16/21 (!) 101.2 kg (223 lb)  04/24/21 98.7 kg (217 lb 9.6  oz)  10/20/20 99.8 kg (220 lb)  04/21/20 97.1 kg (214 lb)  05/11/19 93 kg (205 lb)  01/27/19 88.5 kg (195 lb)  12/29/18 96.9 kg (213 lb 10 oz)  10/21/18 95.3 kg (210 lb)  07/10/18 97.1 kg (214 lb)  05/05/18 100.1 kg (220 lb 10.9 oz)     Patient Active Problem List  Diagnosis  . Elevated LDL cholesterol level  . Lumbar herniated disc  . Lower back pain  . Attention deficit hyperactivity disorder (ADHD), predominantly inattentive type  . 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 Lumbar radiculopathy, chronic  . Essential hypertension  . Spinal cord stimulator status  . Chronic midline low back pain with right-sided sciatica  . OSA (obstructive sleep apnea)  . Chronic pain syndrome  . Encounter for long-term opiate analgesic use  . Encounter for monitoring opioid maintenance therapy  . OSA on CPAP    Outpatient Medications Prior to Visit  Medication Sig Dispense Refill  . lisinopriL  (ZESTRIL ) 10 MG tablet TAKE 1 TABLET DAILY. 90 tablet 2  . simvastatin  (ZOCOR ) 40 MG tablet Take 1 tablet (40 mg total) by mouth at bedtime 90 tablet 0   No facility-administered medications prior to visit.    ROS   Objective:   Vitals:   08/16/21 1547  BP: 135/83  Pulse: 72  Weight: (!) 101.2 kg (223 lb)  PainSc: 0-No pain   Body mass index is 32 kg/m. Home Vitals:     Physical Exam Constitutional:  General: He is not in acute distress.    Appearance: Normal appearance. He is not ill-appearing or toxic-appearing.  HENT:     Head: Normocephalic and atraumatic.  Eyes:     Extraocular Movements: Extraocular movements intact.     Conjunctiva/sclera: Conjunctivae normal.  Cardiovascular:     Rate and Rhythm: Normal rate and regular rhythm.     Pulses: Normal pulses.     Heart sounds: Normal heart sounds.  Pulmonary:     Effort: Pulmonary effort is normal.     Breath sounds: Normal breath sounds.  Musculoskeletal:        General: Normal  range of motion.     Cervical back: Normal range of motion.     Right lower leg: No edema.     Left lower leg: No edema.  Skin:    General: Skin is warm and dry.  Neurological:     General: No focal deficit present.     Mental Status: He is alert and oriented to person, place, and time.  Psychiatric:        Mood and Affect: Mood normal.        Behavior: Behavior normal.        Thought Content: Thought content normal.     Assessment/Plan:   Diagnoses and all orders for this visit:  Chronic fatigue  Erectile dysfunction, unspecified erectile dysfunction type Eugene Huerta is a 46 y.o. man presenting today for evaluation of chronic fatigue and ED. Discussed the many possible etiologies of chronic fatigue including but not limited to hormonal imbalance including low testosterone, sleep disorders, stress/mental health concerns, anemia, thyroid disorders, infectious causes, cardiopulmonary causes, or rheumatologic conditions. Will start with lab work-up detailed below. Pt to come back Friday morning for 8am labs d/t testosterone level. Also encouraged practicing good sleep hygiene, eating well, exercising daily, and taking at least a few minutes a day to practice self care and stress relief.  -     Complete Blood Count (CBC) with Differential; Future -     Comprehensive Metabolic Panel (CMP); Future -     Testosterone,Free And Total; Future -     Thyroid Profile (TSH and T4 Free); Future -     Vitamin B12; Future     This visit was coded based on medical decision making (MDM).            No follow-ups on file.  Future Appointments     Date/Time Provider Department Center Visit Type   08/18/2021 9:00 AM (Arrive by 8:45 AM) [facility]   10/23/2021 11:00 AM (Arrive by 10:45 AM) Perri Constance Sor, PA [facility] OFFICE VISIT       There are no Patient Instructions on file for this visit.  An  after visit summary was provided for the patient either in written format (printed) or through MyChart.

## 2021-10-24 NOTE — Progress Notes (Signed)
 Chief Complaint:   Chief Complaint  Patient presents with  . Hypertension    Subjective:   Eugene Huerta is a 46 y.o. male established patient in today for: HPI:  Fatigue and low libido -Pt is still feeling like crap all the time. -Endorses low energy, foggy brain, low to no libido, and trouble getting and maintaining an erection. -He exercises four days a week at least -Working with a nutritionist at the gym and has been stable on a healthy diet for over 6 months -Sleeping fine, 7-8 hrs per night -Hx of chronic pain, but has been off opiates for about two years -EtOH: 2-3 drinks per week. No other tobacco or substance use.   Patient denies fever, significant change in weight, symptoms of arthritis, unusual rashes, cold intolerance, constipation and change in hair texture., GI blood loss, and witnessed or suspected sleep apnea  Symptoms have  stayed consistent, perhaps slightly worsened Severity has been moderate  Thyroid has been normal, CBC WNL, and testosterone low-normal end.  Hypertension Follow Up Patient is here to follow up of hypertension.  Feels this problems is controlled  Current medications: lisinopril  10mg  daily Taking medications as prescribed? Yes Medication side effects? No Denies: chest pain on exertion, chest pain at rest, palpitations and syncope  Diet: Fasts until lunch. Working with dietician with nutritionist, counting macros and healthy heart diet. Sausage, egg whites, spinach scrambles, protein with vegetables, protein with sweet potatoes, protein shake with unsweetened almond milk with fruit, yogurt with fruit.  Exercise: 3-4 days a week- about hour, cardio/strength training Goal is to be back 185-190lb Has family hx of cardiac disease Last BP check was 130s/80s  Social History   Tobacco Use  Smoking Status Former  . Packs/day: 0.25  . Years: 3.00  . Pack years: 0.75  . Types: Cigarettes  . Quit date: 10/14/2011  . Years since  quitting: 10.0  Smokeless Tobacco Never    BP Readings from Last 3 Encounters:  10/24/21 119/76  08/16/21 135/83  04/24/21 119/75   Lab Results  Component Value Date   GFR 76 08/18/2021    Lab Results  Component Value Date   CREATININE 1.2 08/18/2021   No results found for: [facility]  Wt Readings from Last 5 Encounters:  10/24/21 (!) 103 kg (227 lb)  08/16/21 (!) 101.2 kg (223 lb)  04/24/21 98.7 kg (217 lb 9.6 oz)  10/20/20 99.8 kg (220 lb)  04/21/20 97.1 kg (214 lb)     Patient Active Problem List  Diagnosis  . Elevated LDL cholesterol level  . Lumbar herniated disc  . Lower back pain  . Attention deficit hyperactivity disorder (ADHD), predominantly inattentive type  . 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 Lumbar radiculopathy, chronic  . Essential hypertension  . Spinal cord stimulator status  . Chronic midline low back pain with right-sided sciatica  . OSA (obstructive sleep apnea)  . Chronic pain syndrome  . Encounter for long-term opiate analgesic use  . Encounter for monitoring opioid maintenance therapy  . OSA on CPAP    Outpatient Medications Prior to Visit  Medication Sig Dispense Refill  . lisinopriL  (ZESTRIL ) 10 MG tablet TAKE 1 TABLET DAILY. 90 tablet 2  . simvastatin  (ZOCOR ) 40 MG tablet Take 1 tablet (40 mg total) by mouth at bedtime 90 tablet 0   No facility-administered medications prior to visit.    ROS   Objective:   Vitals:   10/24/21 0744  BP: 119/76  Pulse: 64  Weight: (!) 103 kg (227 lb)  PainSc:   5  PainLoc: Back   Body mass index is 32.57 kg/m. Home Vitals:     Physical Exam Constitutional:      General: He is not in acute distress.    Appearance: Normal appearance. He is not ill-appearing or toxic-appearing.  HENT:     Head: Normocephalic and atraumatic.  Eyes:     Extraocular Movements: Extraocular movements intact.     Conjunctiva/sclera: Conjunctivae normal.   Cardiovascular:     Rate and Rhythm: Normal rate and regular rhythm.     Pulses: Normal pulses.     Heart sounds: Normal heart sounds.  Pulmonary:     Effort: Pulmonary effort is normal.     Breath sounds: Normal breath sounds.  Musculoskeletal:        General: Normal range of motion.     Cervical back: Normal range of motion.     Right lower leg: No edema.     Left lower leg: No edema.  Skin:    General: Skin is warm and dry.  Neurological:     General: No focal deficit present.     Mental Status: He is alert and oriented to person, place, and time.  Psychiatric:        Mood and Affect: Mood normal.        Behavior: Behavior normal.        Thought Content: Thought content normal.     Assessment/Plan:   Diagnoses and all orders for this visit:  Erectile dysfunction, unspecified erectile dysfunction type Low libido Chronic fatigue Pt has been trying to find an endocrinologist to discuss testosterone replacement without luck. [facility] is not taking new pts. [facility] in [place] said they were booking a year out. He would like [facility] referral. Will send today. Also advised him to shop around for other private practices to send referral to. Continue healthy lifestyle including diet, exercise, sleep, and self care. -     Ambulatory Referral to Endocrinology  Essential hypertension Well controlled without meds at this point. Will f/u every six months. Recommend pt to get cuff for home checks. Continue healthy diet and exercise.  This visit was coded based on medical decision making (MDM).            No follow-ups on file.  Future Appointments     Date/Time Provider Department Center Visit Type   02/05/2022 8:00 AM (Arrive by 7:45 AM) Perri Constance Sor, PA [facility] PHYSICAL       There are no Patient Instructions on file for this visit.  An after visit summary was provided for the patient either in written format (printed) or through  MyChart.

## 2022-08-07 IMAGING — CR DG THORACIC SPINE 2V
3 series · 3 of 3 positions shown · non-contrast
Comparison: None.

CLINICAL DATA: Upper back pain.

EXAM:
THORACIC SPINE 2 VIEWS

[t-spine ap]
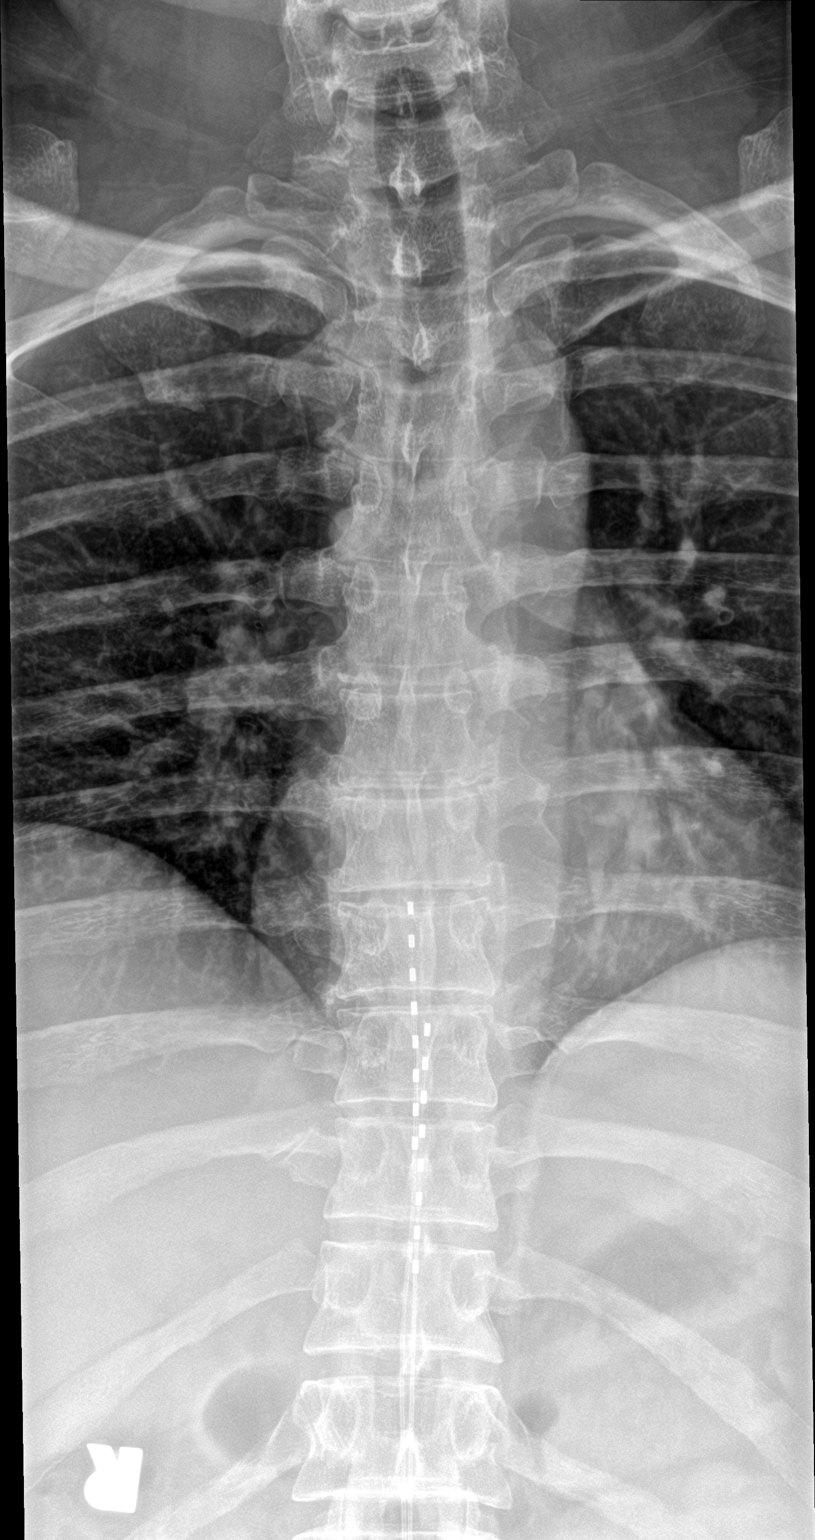

[t-spine lat]
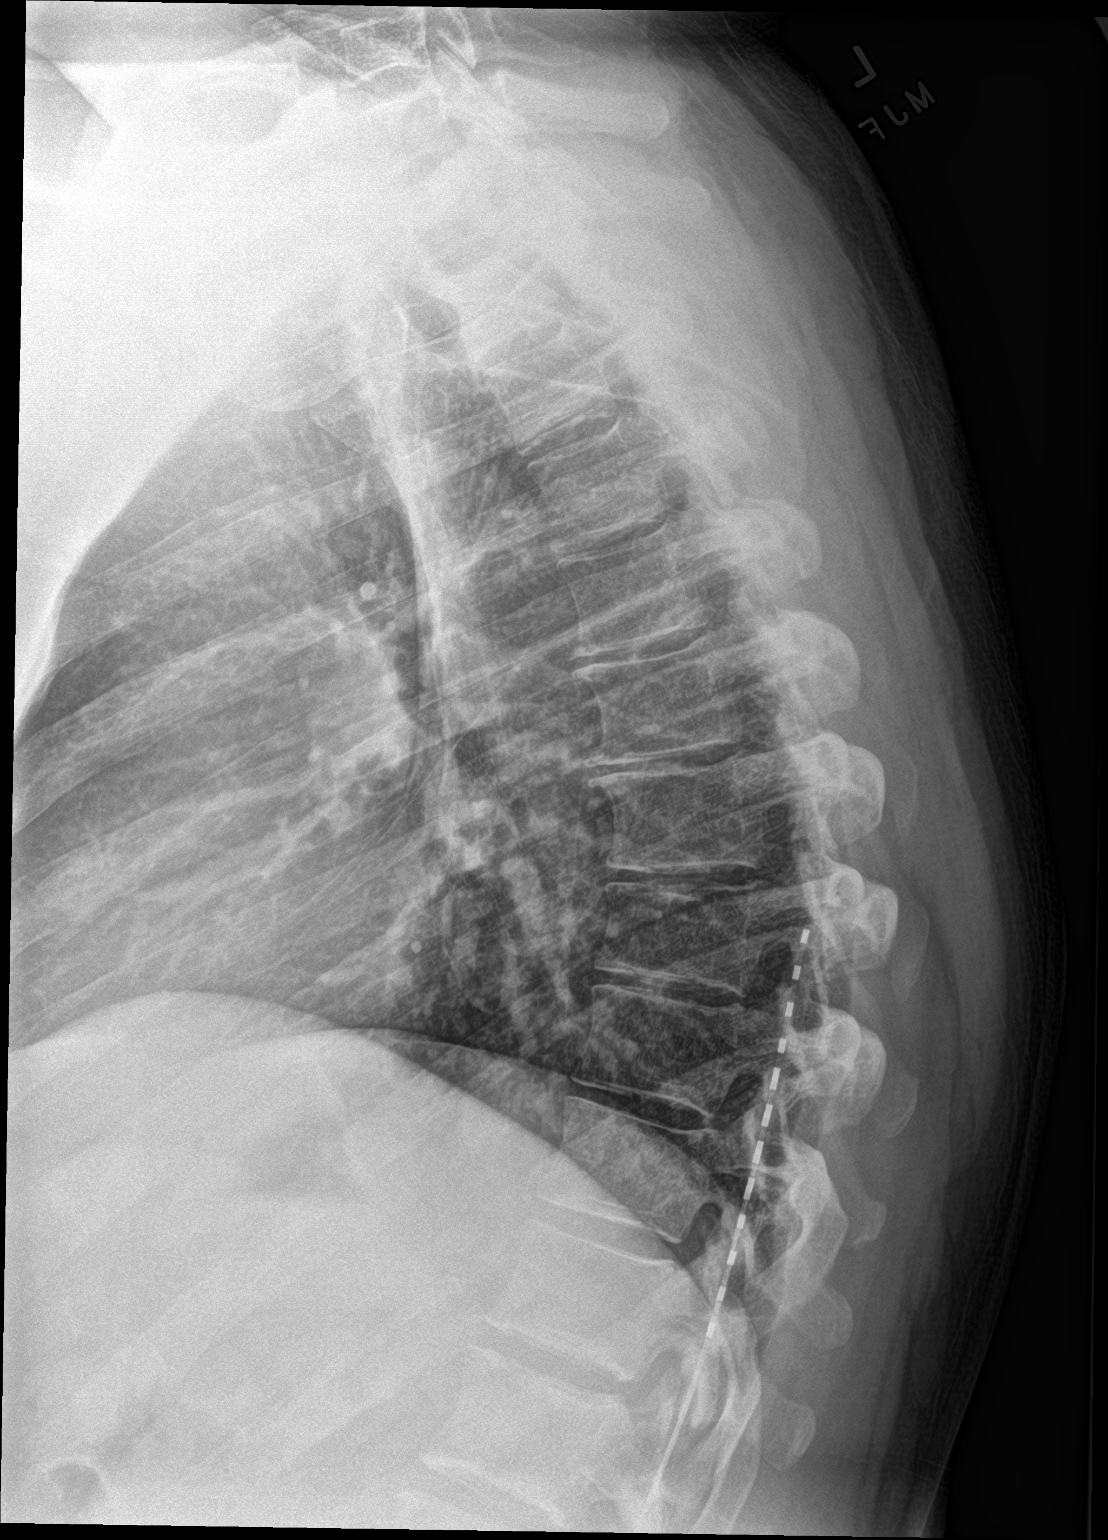

[t-spine swimmers]
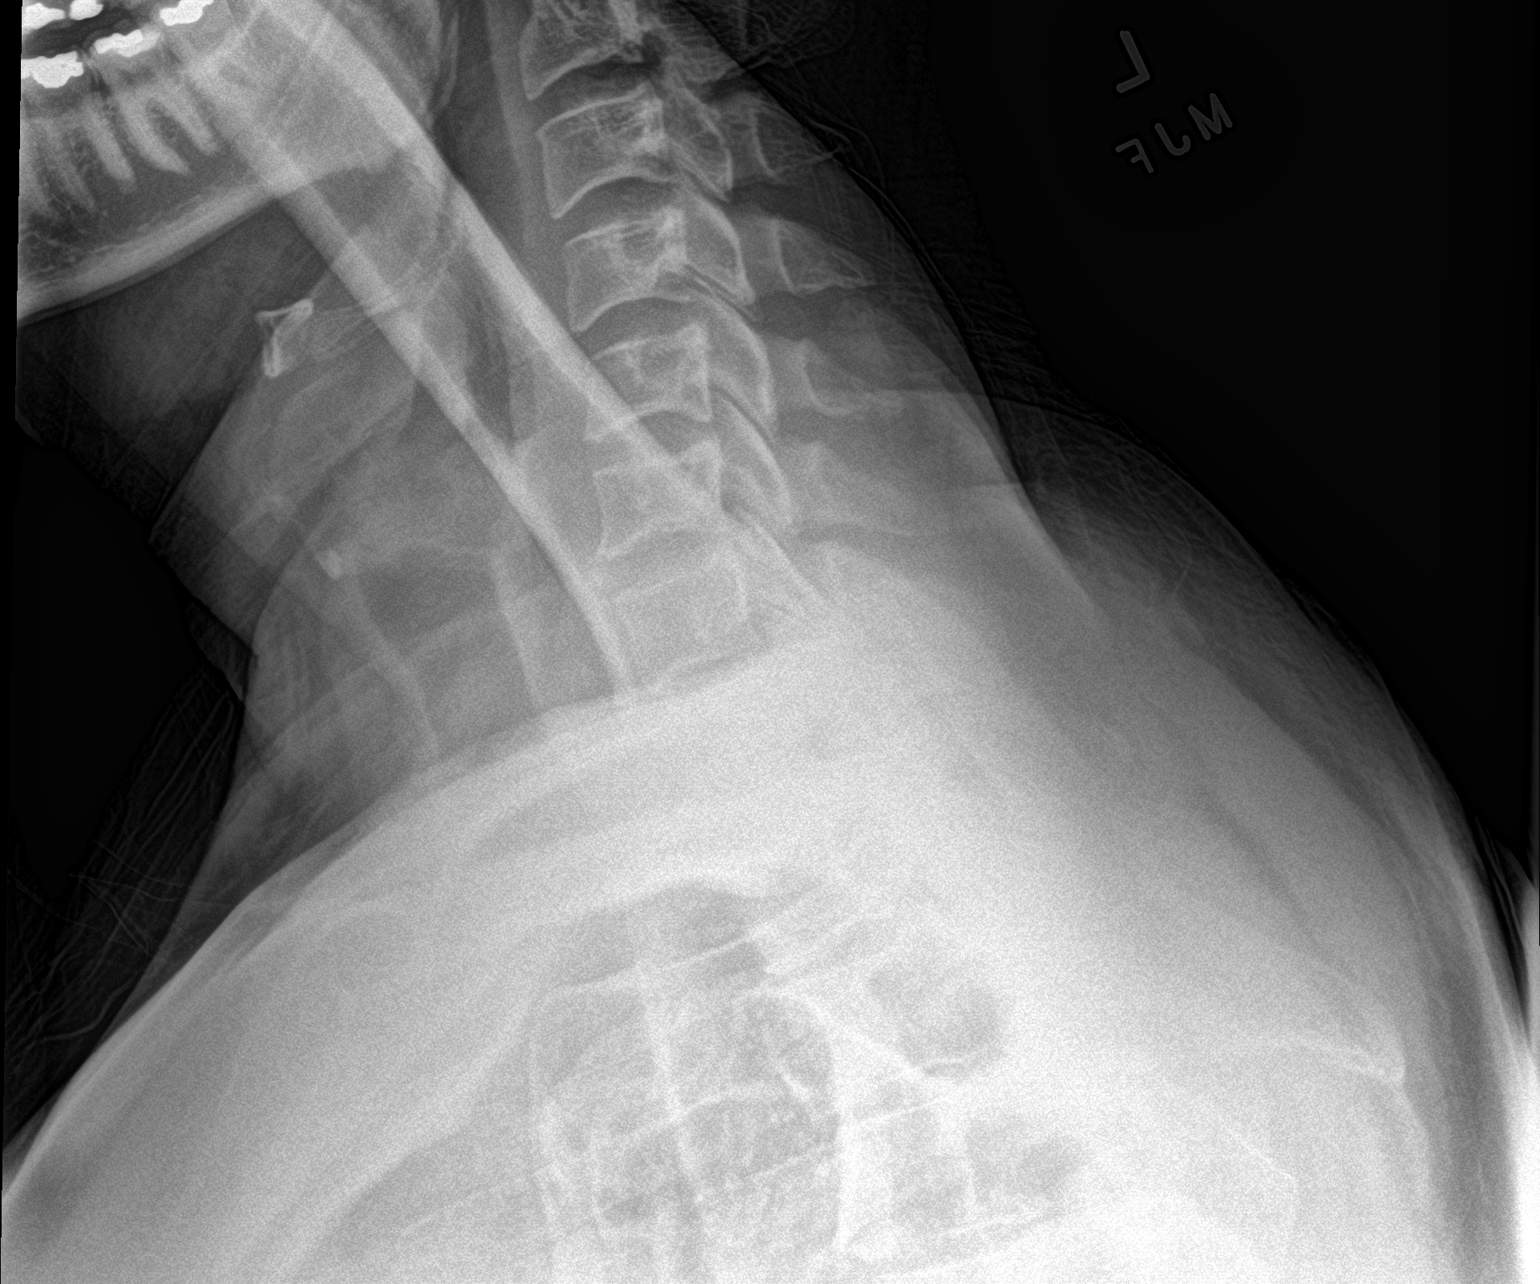

[3 of 3 positions shown; findings below may reference images not displayed]

FINDINGS: There is no evidence of thoracic spine fracture. Alignment is
normal. Mild anterior osteophyte formation is seen at the level of
T5-T6 and T6-T7. Mild intervertebral disc space narrowing is also
seen at these levels. Spinal stimulator wires are seen with the
distal tips noted at the levels of T7-T8 and T8-T9.
IMPRESSION: 1. Mild degenerative changes in the midthoracic spine.
2. Spinal stimulator wires with the distal tips noted at the levels
of T7-T8 and T8-T9.

## 2024-01-20 ENCOUNTER — Ambulatory Visit: Attending: Nurse Practitioner | Admitting: Nurse Practitioner

## 2024-01-20 ENCOUNTER — Encounter: Payer: Self-pay | Admitting: Nurse Practitioner

## 2024-01-20 VITALS — BP 148/83 | HR 86 | Temp 97.7°F | Resp 20 | Ht 69.0 in | Wt 205.0 lb

## 2024-01-20 DIAGNOSIS — M5416 Radiculopathy, lumbar region: Secondary | ICD-10-CM | POA: Diagnosis present

## 2024-01-20 DIAGNOSIS — M961 Postlaminectomy syndrome, not elsewhere classified: Secondary | ICD-10-CM | POA: Diagnosis present

## 2024-01-20 DIAGNOSIS — E7849 Other hyperlipidemia: Secondary | ICD-10-CM | POA: Diagnosis present

## 2024-01-20 DIAGNOSIS — Z9689 Presence of other specified functional implants: Secondary | ICD-10-CM | POA: Insufficient documentation

## 2024-01-20 DIAGNOSIS — G894 Chronic pain syndrome: Secondary | ICD-10-CM | POA: Diagnosis present

## 2024-01-20 NOTE — Progress Notes (Signed)
 PROVIDER NOTE: Interpretation of information contained herein should be left to medically-trained personnel. Specific patient instructions are provided elsewhere under Patient Instructions section of medical record. This document was created in part using AI and STT-dictation technology, any transcriptional errors that may result from this process are unintentional.  Patient: Eugene Huerta  Service: E/M   PCP: Tonette Lauraine HERO, PA-C  DOB: September 18, 1975  DOS: 01/20/2024  Provider: Emmy MARLA Blanch, NP  MRN: [id number]  Delivery: Face-to-face  Specialty: Interventional Pain Management  Type: Established Patient  Setting: Ambulatory outpatient facility  Specialty designation: 09  Referring Prov.: Tonette Lauraine HERO, PA-C  Location: Outpatient office facility       History of present illness (HPI) Eugene Huerta, a 48 y.o. year old male, is here today because of his Spinal cord stimulator status [Z96.89]. Eugene Huerta primary complain today is Back Pain and Knee pain   Pain Assessment: Severity of Chronic pain is reported as a 6 /10. Location: Back Lower/Towards right hip, right buttock, to right thing to knee. Onset: More than a month ago. Quality: Aching, Sharp, Stabbing. Timing: Constant. Modifying factor(s): Denies. Vitals:  height is 5' 9 (1.753 m) and weight is 205 lb (93 kg). His temporal temperature is 97.7 F (36.5 C). His blood pressure is 148/83 (abnormal) and his pulse is 86. His respiration is 20 and oxygen saturation is 95%.  BMI: Estimated body mass index is 30.27 kg/m as calculated from the following:   Height as of this encounter: 5' 9 (1.753 m).   Weight as of this encounter: 205 lb (93 kg).  Last encounter: Visit date not found. Last procedure: Visit date not found.  Reason for encounter: follow-up evaluation.   Discussed the use of AI scribe software for clinical note transcription with the patient, who gave verbal consent to proceed.  History of Present  Illness   Eugene Huerta is a 48 year old male with a history of back surgeries and a spinal cord stimulator who presents with radiating leg pain and knee pain. He was referred by his primary care doctor for a check-up with the pain management team.  He experiences pain originating in his right side, radiating down to his knee, described as aching and particularly severe in the knee. The pain does not extend to his foot. He also has sharp, stabbing pains in his buttocks, persisting for the past ten years.  He has undergone two back surgeries and currently has a Nevro spinal cord stimulator implanted, placed at [facility] in [facility]22. The stimulator provides limited relief, sometimes 'knocking the edge off' the pain. He has not contacted the manufacturer for reprogramming or battery issues.  He has discontinued the use of pain medications such as oxycodone and is currently not on any pain medications, except for a recent prescription from his primary care doctor. He experiences flare-ups approximately every three weeks, during which he is unable to sleep or perform daily activities such as tying his shoes.  He reports a bruise on his knee noticed by his wife, which appeared without any known injury. He has not had a knee x-ray but has had lumbar spine x-rays showing degenerative disc disease and arthritis. He is currently on a prednisone taper for inflammation.  He reports that previous x-rays showed degenerative disc disease, arthritis, and mild vertebral disc height loss at L4-L5 and L5-S1.     Pharmacotherapy Assessment   Monitoring: [place] PMP: PDMP not reviewed this encounter.  Pharmacotherapy: No side-effects or adverse reactions reported. Compliance: No problems identified. Effectiveness: Clinically acceptable.  Eugene Huerta, [place]  01/20/2024  1:33 PM  Sign when Signing Visit Safety precautions to be maintained throughout the outpatient stay will include: orient to surroundings, keep bed  in low position, maintain call bell within reach at all times, provide assistance with transfer out of bed and ambulation.     UDS:  No results found for: SUMMARY  No results found for: CBDTHCR No results found for: D8THCCBX No results found for: D9THCCBX  ROS  Constitutional: Denies any fever or chills Gastrointestinal: No reported hemesis, hematochezia, vomiting, or acute GI distress Musculoskeletal: back pain radiate down to hip and anterior aspect of knee (R>L) Neurological: No reported episodes of acute onset apraxia, aphasia, dysarthria, agnosia, amnesia, paralysis, loss of coordination, or loss of consciousness  Medication Review  DULoxetine, amphetamine -dextroamphetamine, buprenorphine, gabapentin , ketorolac , lisinopril , predniSONE, simvastatin , and testosterone cypionate  History Review  Allergy: Eugene Huerta is allergic to denture adhesive. Drug: Eugene Huerta  reports no history of drug use. Alcohol:  reports no history of alcohol use. Tobacco:  reports that he has quit smoking. He has never used smokeless tobacco. Social: Eugene Huerta  reports that he has quit smoking. He has never used smokeless tobacco. He reports that he does not drink alcohol and does not use drugs. Medical:  has a past medical history of Hyperlipidemia and Hypertension. Surgical: Eugene Huerta  has a past surgical history that includes Back surgery; Hernia repair; Appendectomy; and Hand tendon surgery. Family: family history includes Hypertension in his father.  Laboratory Chemistry Profile   Renal Lab Results  Component Value Date   BUN 15 09/09/2015   CREATININE 0.89 03/19/2019   BCR 14 09/09/2015   GFRAA >60 03/19/2019   GFRNONAA >60 03/19/2019    Hepatic Lab Results  Component Value Date   ALBUMIN 4.2 09/09/2015    Electrolytes Lab Results  Component Value Date   NA 144 09/09/2015   K 5.2 09/09/2015   CL 100 09/09/2015   CALCIUM 9.7 09/09/2015   PHOS 4.5 09/09/2015     Bone No results found for: VD25OH, VD125OH2TOT, CI6874NY7, CI7874NY7, 25OHVITD1, 25OHVITD2, 25OHVITD3, TESTOFREE, TESTOSTERONE  Inflammation (CRP: Acute Phase) (ESR: Chronic Phase) No results found for: CRP, ESRSEDRATE, LATICACIDVEN       Note: Above Lab results reviewed.  Recent Imaging Review  DG Thoracic Spine 2 View CLINICAL DATA:  Upper back pain.  EXAM: THORACIC SPINE 2 VIEWS  COMPARISON:  None.  FINDINGS: There is no evidence of thoracic spine fracture. Alignment is normal. Mild anterior osteophyte formation is seen at the level of T5-T6 and T6-T7. Mild intervertebral disc space narrowing is also seen at these levels. Spinal stimulator wires are seen with the distal tips noted at the levels of T7-T8 and T8-T9.  IMPRESSION: 1. Mild degenerative changes in the midthoracic spine. 2. Spinal stimulator wires with the distal tips noted at the levels of T7-T8 and T8-T9.  Electronically Signed   By: Suzen Dials M.D.   On: 02/15/2021 20:08 DG Lumbar Spine Complete W/Bend CLINICAL DATA:  Pain in upper and lower back.  EXAM: LUMBAR SPINE - COMPLETE WITH BENDING VIEWS  COMPARISON:  February 02, 2013  FINDINGS: There is no evidence of lumbar spine fracture. Alignment is normal without changes in alignment seen on flexion or extension images. Intervertebral disc spaces are maintained. A radiopaque stimulator and associated stimulator wires is seen entering at the level of L1-L2.  IMPRESSION: Interval  spinal stimulator wire placement and positioning, as described above, without additional significant interval changes when compared to the prior study.  Electronically Signed   By: Suzen Dials M.D.   On: 02/15/2021 20:06 Note: Reviewed        Physical Exam  Vitals: BP (!) 148/83 (BP Location: Right Arm, Patient Position: Sitting, Cuff Size: Normal)   Pulse 86   Temp 97.7 F (36.5 C) (Temporal)   Resp 20   Ht 5' 9 (1.753 m)   Wt  205 lb (93 kg)   SpO2 95%   BMI 30.27 kg/m  BMI: Estimated body mass index is 30.27 kg/m as calculated from the following:   Height as of this encounter: 5' 9 (1.753 m).   Weight as of this encounter: 205 lb (93 kg). Ideal: Ideal body weight: 70.7 kg (155 lb 13.8 oz) Adjusted ideal body weight: 79.6 kg (175 lb 8.3 oz) General appearance: Well nourished, well developed, and well hydrated. In no apparent acute distress Mental status: Alert, oriented x 3 (person, place, & time)       Respiratory: No evidence of acute respiratory distress Eyes: PERLA  Musculoskeletal: +LBP Assessment   Diagnosis Status  1. Spinal cord stimulator status   2. Familial multiple lipoprotein-type hyperlipidemia   3. Lumbar radiculopathy, chronic   4. Lumbar post-laminectomy syndrome   5. Chronic pain syndrome    Controlled Controlled Controlled   Updated Problems: No problems updated.  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 Recent x-ray shows mild degenerative disc disease and moderate facet osteoarthritis. Current spinal cord stimulator provides limited relief. - Continue current steroid taper. - Contact Nevro for spinal cord stimulator optimization and battery check. - Consider facet joint injection if no improvement after stimulator optimization. - Schedule follow-up appointment for further evaluation if needed.  Presence of spinal cord stimulator implant Spinal cord stimulator implant since 2022 providing limited relief. Potential issues with battery or programming may contribute to inadequate pain control. - Contact [employer] for potential reprogramming or battery check of the spinal cord stimulator.       Eugene Huerta has a current  medication list which includes the following long-term medication(s): lisinopril , simvastatin , testosterone cypionate, amphetamine -dextroamphetamine, duloxetine, and gabapentin .  Pharmacotherapy (Medications Ordered): No orders of the defined types were placed in this encounter.  Orders:  No orders of the defined types were placed in this encounter.       Return for (PRN), (F2F), eval by MD.    Recent Visits No visits were found meeting these conditions. Showing recent visits within past 90 days and meeting all other requirements Today's Visits Date Type Provider Dept  01/20/24 Office Visit Keyanah Kozicki K, NP [facility]  Showing today's visits and meeting all other requirements Future Appointments Date Type Provider Dept  03/12/24 Appointment Marcelino Nurse, MD [facility]  Showing future appointments within next 90 days and meeting all other requirements  I discussed the assessment and treatment plan with the patient. The patient was provided an opportunity to ask questions and all were answered. The patient agreed with the plan and demonstrated an understanding of the instructions.  Patient advised to call back or seek an in-person evaluation if the symptoms or condition worsens.  I personally spent a total of 20 minutes in the care of  the patient today including preparing to see the patient, getting/reviewing separately obtained history, performing a medically appropriate exam/evaluation, counseling and educating, placing orders, referring and communicating with other health care professionals, documenting clinical information in the EHR, independently interpreting results, communicating results, and coordinating care.   Note by: Allen Egerton K Joaopedro Eschbach, NP (TTS and AI technology used. I apologize for any typographical errors that were not detected and corrected.) Date: 01/20/2024; Time: 2:43 PM

## 2024-01-20 NOTE — Progress Notes (Signed)
 Safety precautions to be maintained throughout the outpatient stay will include: orient to surroundings, keep bed in low position, maintain call bell within reach at all times, provide assistance with transfer out of bed and ambulation.

## 2024-03-12 ENCOUNTER — Encounter: Payer: Self-pay | Admitting: Student in an Organized Health Care Education/Training Program

## 2024-03-12 ENCOUNTER — Ambulatory Visit: Admitting: Student in an Organized Health Care Education/Training Program

## 2024-03-12 ENCOUNTER — Ambulatory Visit
Admission: RE | Admit: 2024-03-12 | Discharge: 2024-03-12 | Disposition: A | Discharge status: Home / Self Care | Source: Ambulatory Visit | Attending: Student in an Organized Health Care Education/Training Program | Admitting: Student in an Organized Health Care Education/Training Program

## 2024-03-12 VITALS — BP 115/84 | HR 74 | Temp 97.7°F | Resp 16 | Ht 69.0 in | Wt 200.0 lb

## 2024-03-12 DIAGNOSIS — G894 Chronic pain syndrome: Secondary | ICD-10-CM | POA: Insufficient documentation

## 2024-03-12 DIAGNOSIS — M5416 Radiculopathy, lumbar region: Secondary | ICD-10-CM | POA: Insufficient documentation

## 2024-03-12 DIAGNOSIS — Z9689 Presence of other specified functional implants: Secondary | ICD-10-CM

## 2024-03-12 DIAGNOSIS — M961 Postlaminectomy syndrome, not elsewhere classified: Secondary | ICD-10-CM

## 2024-03-12 DIAGNOSIS — M47816 Spondylosis without myelopathy or radiculopathy, lumbar region: Secondary | ICD-10-CM

## 2024-03-12 MED ORDER — CELECOXIB 100 MG PO CAPS: 100.0000 mg | ORAL_CAPSULE | Freq: Two times a day (BID) | ORAL | 1 refills | Status: AC

## 2024-03-12 NOTE — Progress Notes (Signed)
 Safety precautions to be maintained throughout the outpatient stay will include: orient to surroundings, keep bed in low position, maintain call bell within reach at all times, provide assistance with transfer out of bed and ambulation.

## 2024-03-12 NOTE — Progress Notes (Signed)
 PROVIDER NOTE: Interpretation of information contained herein should be left to medically-trained personnel. Specific patient instructions are provided elsewhere under Patient Instructions section of medical record. This document was created in part using AI and STT-dictation technology, any transcriptional errors that may result from this process are unintentional.  Patient: Eugene Huerta  Service: E/M   PCP: Tonette Lauraine HERO, PA-C  DOB: [birth year]  DOS: 03/12/2024  Provider: Wallie Sherry, MD  MRN: [id number]  Delivery: Face-to-face  Specialty: Interventional Pain Management  Type: Established Patient  Setting: Ambulatory outpatient facility  Specialty designation: 09  Referring Prov.: Tonette Lauraine HERO, PA-C  Location: Outpatient office facility       History of present illness (HPI) Eugene Huerta, a 49 y.o. year old male, is here today because of his Lumbar spondylosis [M47.816]. Eugene Huerta's primary complain today is Back Pain (Lower right lower back into right buttocks )  Pertinent problems: Eugene Huerta has DDD (degenerative disc disease), lumbar; Chronic midline low back pain with right-sided sciatica; Lumbar herniated disc; Failed back surgical syndrome; Lumbar post-laminectomy syndrome; and Marijuana use on their pertinent problem list.  Pain Assessment: Severity of Chronic pain is reported as a 3 /10. Location: Back Right/From lower back into right buttocks. Onset: More than a month ago. Quality: Aching, Constant. Timing: Constant. Modifying factor(s): Predinsone taper with flare up. SCS only helps some. Hot bath.. Vitals:  height is 5' 9 (1.753 m) and weight is 200 lb (90.7 kg). His temporal temperature is 97.7 F (36.5 C). His blood pressure is 115/84 and his pulse is 74. His respiration is 16 and oxygen saturation is 98%.  BMI: Estimated body mass index is 29.53 kg/m as calculated from the following:   Height as of this encounter: 5' 9 (1.753 m).   Weight as  of this encounter: 200 lb (90.7 kg).  Last encounter: Visit date not found. Last procedure: Visit date not found.  Reason for encounter:   Discussed the use of AI scribe software for clinical note transcription with the patient, who gave verbal consent to proceed.  History of Present Illness   Ayman Brull is a 49 year old male with a history of spine surgery and spinal cord stimulator placement who presents with issues related to his spinal cord stimulator (NEVRO) and ongoing back pain.  He underwent spine surgery in 2015 for a herniated disc and had a spinal cord stimulator placed. Initially, the stimulator provided relief for a couple of years, but he stopped using it due to experiencing uncomfortable shocks, particularly when sitting, leaning back, or in his car. This sensation became more pronounced after he lost weight, affecting his right leg. He has not used the stimulator for some time due to these issues.  He experiences ongoing back pain, which is not as constant as before but still present with flare-ups. A significant flare-up recently led him to seek care, during which he received an injection from his primary care provider to reduce inflammation and was possibly given a course of prednisone. The pain is located in his mid-back, particularly below the shoulder blades, and worsens when standing for long periods, though he is comfortable when walking or sitting. He has not used gabapentin  recently due to its sedative effects and has not tried anti-inflammatories like Celebrex  or meloxicam.  He has made significant lifestyle changes, including adopting a carnivore diet, which helped him lose 40 pounds. Despite feeling better overall, his cholesterol levels increased, prompting him to adjust his diet to  include more vegetables and fewer carbohydrates. He successfully weaned off opioids after seven years of use, which he found beneficial for his mental and physical health, noting  that long-term opioid use had adverse effects, including on his dental health.       ROS  Constitutional: Denies any fever or chills Gastrointestinal: No reported hemesis, hematochezia, vomiting, or acute GI distress Musculoskeletal: + low back pain Neurological: No reported episodes of acute onset apraxia, aphasia, dysarthria, agnosia, amnesia, paralysis, loss of coordination, or loss of consciousness  Medication Review  celecoxib , lisinopril , simvastatin , and testosterone cypionate  History Review  Allergy: Eugene Huerta is allergic to denture adhesive. Drug: Eugene Huerta  reports no history of drug use. Alcohol:  reports no history of alcohol use. Tobacco:  reports that he has quit smoking. He has never used smokeless tobacco. Social: Eugene Huerta  reports that he has quit smoking. He has never used smokeless tobacco. He reports that he does not drink alcohol and does not use drugs. Medical:  has a past medical history of Hyperlipidemia and Hypertension. Surgical: Eugene Huerta  has a past surgical history that includes Back surgery; Hernia repair; Appendectomy; and Hand tendon surgery. Family: family history includes Hypertension in his father.  Laboratory Chemistry Profile   Renal Lab Results  Component Value Date   Huerta 15 09/09/2015   CREATININE 0.89 03/19/2019   BCR 14 09/09/2015   GFRAA >60 03/19/2019   GFRNONAA >60 03/19/2019    Hepatic Lab Results  Component Value Date   ALBUMIN 4.2 09/09/2015    Electrolytes Lab Results  Component Value Date   NA 144 09/09/2015   K 5.2 09/09/2015   CL 100 09/09/2015   CALCIUM 9.7 09/09/2015   PHOS 4.5 09/09/2015    Bone No results found for: VD25OH, VD125OH2TOT, CI6874NY7, CI7874NY7, 25OHVITD1, 25OHVITD2, 25OHVITD3, TESTOFREE, TESTOSTERONE  Inflammation (CRP: Acute Phase) (ESR: Chronic Phase) No results found for: CRP, ESRSEDRATE, LATICACIDVEN       Note: Above Lab results reviewed.  Recent Imaging  Review  DG Thoracic Spine 2 View CLINICAL DATA:  Upper back pain.  EXAM: THORACIC SPINE 2 VIEWS  COMPARISON:  None.  FINDINGS: There is no evidence of thoracic spine fracture. Alignment is normal. Mild anterior osteophyte formation is seen at the level of T5-T6 and T6-T7. Mild intervertebral disc space narrowing is also seen at these levels. Spinal stimulator wires are seen with the distal tips noted at the levels of T7-T8 and T8-T9.  IMPRESSION: 1. Mild degenerative changes in the midthoracic spine. 2. Spinal stimulator wires with the distal tips noted at the levels of T7-T8 and T8-T9.  Electronically Signed   By: Suzen Dials M.D.   On: 02/15/2021 20:08 DG Lumbar Spine Complete W/Bend CLINICAL DATA:  Pain in upper and lower back.  EXAM: LUMBAR SPINE - COMPLETE WITH BENDING VIEWS  COMPARISON:  February 02, 2013  FINDINGS: There is no evidence of lumbar spine fracture. Alignment is normal without changes in alignment seen on flexion or extension images. Intervertebral disc spaces are maintained. A radiopaque stimulator and associated stimulator wires is seen entering at the level of L1-L2.  IMPRESSION: Interval spinal stimulator wire placement and positioning, as described above, without additional significant interval changes when compared to the prior study.  Electronically Signed   By: Suzen Dials M.D.   On: 02/15/2021 20:06  Prentiss Fonda LABOR, MD - 01/16/2024 Formatting of this note might be different from the original. EXAM: XR LUMBAR SPINE 2 OR 3 VIEWS DATE:  01/16/2024 10:31 AM ACCESSION: [id number] UN DICTATED: 01/16/2024 10:39 AM INTERPRETATION LOCATION: [facility]  CLINICAL INDICATION: 49 years old Male with low back pain  - M54.50 - Low back pain, unspecified back pain laterality, unspecified chronicity, unspecified whether sciatica present    COMPARISON: None.  TECHNIQUE: AP and lateral views of the lumbar spine.  FINDINGS: Mild  thoracolumbar dextrocurvature. Vertebral body heights are preserved. Mild vertebral disc height loss at L4-5 and L5-S1 with mild endplate irregularity. Moderate lower lumbar facet osteoarthrosis. No listhesis. Sacroiliac joint alignment normal. Implantable spinal stimulator device terminates in the lower thoracic spine.  IMPRESSION: Mild lower lumbar degenerative disc disease and moderate lower lumbar facet osteoarthrosis. Exam End: 01/16/24 10:31   Specimen Collected: 01/16/24 10:39 Last Resulted: 01/16/24 10:40    Note: Reviewed        Physical Exam  Vitals: BP 115/84 (Patient Position: Sitting, Cuff Size: Normal)   Pulse 74   Temp 97.7 F (36.5 C) (Temporal)   Resp 16   Ht 5' 9 (1.753 m)   Wt 200 lb (90.7 kg)   SpO2 98%   BMI 29.53 kg/m  BMI: Estimated body mass index is 29.53 kg/m as calculated from the following:   Height as of this encounter: 5' 9 (1.753 m).   Weight as of this encounter: 200 lb (90.7 kg). Ideal: Ideal body weight: 70.7 kg (155 lb 13.8 oz) Adjusted ideal body weight: 78.7 kg (173 lb 8.3 oz) General appearance: Well nourished, well developed, and well hydrated. In no apparent acute distress Mental status: Alert, oriented x 3 (person, place, & time)       Respiratory: No evidence of acute respiratory distress Eyes: PERLA  Mid thoracic and periscapular pain + low back pain, worse with facet loading 5 out of 5 strength bilateral lower extremity: Plantar flexion, dorsiflexion, knee flexion, knee extension.   Assessment   Diagnosis  1. Lumbar spondylosis   2. Lumbar facet arthropathy   3. Spinal cord stimulator status   4. Lumbar radiculopathy, chronic   5. Lumbar post-laminectomy syndrome   6. Chronic pain syndrome      Updated Problems: No problems updated.  Plan of Care  Assessment and Plan    Lumbar post-laminectomy syndrome   Chronic lumbar post-laminectomy syndrome presents with persistent pain and flare-ups. Moderate arthritis in the  lower back, as seen on previous x-ray, likely contributes to the pain. Pain worsens with prolonged standing and improves with sitting or walking. Previous spinal cord stimulator use was discontinued due to discomfort from shocks. An MRI of the lumbar spine is ordered to assess current status and guide further management. Discussed NSAIDs such as Celebrex , meloxicam, or diclofenac for pain management as needed, with instructions to take after meals. Trial Celebrex  as below prn  Chronic pain syndrome   Chronic pain significantly impacts his quality of life. Previous opioid use was discontinued due to adverse effects on mental and physical health. Current pain management includes lifestyle modifications and dietary changes, resulting in weight loss and improved mental health. Gabapentin  was previously tried but caused drowsiness. Continue lifestyle modifications and dietary changes to support overall health and pain management. Prescribed NSAIDs for pain management as needed, with instructions to take after meals.  Spinal cord stimulator management   The spinal cord stimulator is in place but not in use due to discomfort from shocks. There is potential for an upgrade to a new [employer] system that adjusts signals based on dynamic changes in lead position, reducing discomfort. A discussion with  a [employer] representative is underway to explore options for a system upgrade. X-rays of the thoracic spine are ordered to assess the position of leads and guide the potential system upgrade.        Pharmacotherapy (Medications Ordered): Meds ordered this encounter  Medications   celecoxib  (CELEBREX ) 100 MG capsule    Sig: Take 1 capsule (100 mg total) by mouth 2 (two) times daily.    Dispense:  60 capsule    Refill:  1   Orders:  Orders Placed This Encounter  Procedures   DG Thoracic Spine 2 View    Standing Status:   Future    Expiration Date:   03/12/2025    Reason for Exam (SYMPTOM  OR DIAGNOSIS  REQUIRED):   mid thoracic pain    Preferred imaging location?:   [facility]   MR LUMBAR SPINE WO CONTRAST    Patient presents with axial pain with possible radicular component. Please assist us  in identifying specific level(s) and laterality of any additional findings such as: 1. Facet (Zygapophyseal) joint DJD (Hypertrophy, space narrowing, subchondral sclerosis, and/or osteophyte formation) 2. DDD and/or IVDD (Loss of disc height, desiccation, gas patterns, osteophytes, endplate sclerosis, or Black disc disease) 3. Pars defects 4. Spondylolisthesis, spondylosis, and/or spondyloarthropathies (include Degree/Grade of displacement in mm) (stability) 5. Vertebral body Fractures (acute/chronic) (state percentage of collapse) 6. Demineralization (osteopenia/osteoporotic) 7. Bone pathology 8. Foraminal narrowing  9. Surgical changes 10. Central, Lateral Recess, and/or Foraminal Stenosis (include AP diameter of stenosis in mm) 11. Surgical changes (hardware type, status, and presence of fibrosis) 12. Modic Type Changes (MRI only) 13. IVDD (Disc bulge, protrusion, herniation, extrusion) (Level, laterality, extent)    Standing Status:   Future    Expiration Date:   06/10/2024    Scheduling Instructions:     Please make sure that the patient understands that this needs to be done as soon as possible. Never have the patient do the imaging just before the next appointment. Inform patient that having the imaging done within the [facility] will expedite the availability of the results and will provide      imaging availability to the requesting physician. In addition inform the patient that the imaging order has an expiration date and will not be renewed if not done within the active period.    What is the patient's sedation requirement?:   No Sedation    Does the patient have a pacemaker or implanted devices?:   No    Preferred imaging location?:   [facility] (table limit-350lbs)     Call Results- Best Contact Number?:   [phone] [facility]    Radiology Contrast Protocol - do NOT remove file path:   [email or web address]    Return in about 4 weeks (around 04/09/2024) for Review MRI and Nevro/[employer]tr[employer] IPG .    Recent Visits Date Type Provider Dept  01/20/24 Office Visit Patel, Seema K, NP [facility]  Showing recent visits within past 90 days and meeting all other requirements Today's Visits Date Type Provider Dept  03/12/24 Office Visit Marcelino Nurse, MD [facility]  Showing today's visits and meeting all other requirements Future Appointments No visits were found meeting these conditions. Showing future appointments within next 90 days and meeting all other requirements  I discussed the assessment and treatment plan with the patient. The patient was provided an opportunity to ask questions and all were answered. The patient agreed with the plan and demonstrated  an understanding of the instructions.  Patient advised to call back or seek an in-person evaluation if the symptoms or condition worsens.  I personally spent a total of 30 minutes in the care of the patient today including preparing to see the patient, getting/reviewing separately obtained history, performing a medically appropriate exam/evaluation, counseling and educating, placing orders, and documenting clinical information in the EHR.   Note by: Wallie Sherry, MD (TTS and AI technology used. I apologize for any typographical errors that were not detected and corrected.) Date: 03/12/2024; Time: 8:50 AM

## 2024-03-12 NOTE — Patient Instructions (Signed)

## 2024-04-10 ENCOUNTER — Ambulatory Visit: Admission: RE | Admit: 2024-04-10

## 2024-04-10 DIAGNOSIS — M961 Postlaminectomy syndrome, not elsewhere classified: Secondary | ICD-10-CM

## 2024-04-10 DIAGNOSIS — Z9689 Presence of other specified functional implants: Secondary | ICD-10-CM

## 2024-04-10 DIAGNOSIS — M5416 Radiculopathy, lumbar region: Secondary | ICD-10-CM

## 2024-04-10 DIAGNOSIS — G894 Chronic pain syndrome: Secondary | ICD-10-CM

## 2024-04-14 ENCOUNTER — Ambulatory Visit: Admitting: Student in an Organized Health Care Education/Training Program

## 2024-04-23 ENCOUNTER — Ambulatory Visit: Admitting: Student in an Organized Health Care Education/Training Program

## 2024-04-23 ENCOUNTER — Ambulatory Visit: Admit: 2024-04-23 | Admitting: Gastroenterology
